# Patient Record
Sex: Female | Born: 1940
Health system: Southern US, Community
[De-identification: ages and names within clinical notes are randomized; demographics above are authoritative.]

## PROBLEM LIST (undated history)

## (undated) DIAGNOSIS — C801 Malignant (primary) neoplasm, unspecified: Secondary | ICD-10-CM

## (undated) DIAGNOSIS — F419 Anxiety disorder, unspecified: Secondary | ICD-10-CM

## (undated) DIAGNOSIS — J189 Pneumonia, unspecified organism: Secondary | ICD-10-CM

## (undated) DIAGNOSIS — J9611 Chronic respiratory failure with hypoxia: Secondary | ICD-10-CM

## (undated) DIAGNOSIS — I10 Essential (primary) hypertension: Secondary | ICD-10-CM

## (undated) DIAGNOSIS — J440 Chronic obstructive pulmonary disease with acute lower respiratory infection: Secondary | ICD-10-CM

## (undated) DIAGNOSIS — R5381 Other malaise: Secondary | ICD-10-CM

## (undated) DIAGNOSIS — H269 Unspecified cataract: Secondary | ICD-10-CM

## (undated) DIAGNOSIS — I059 Rheumatic mitral valve disease, unspecified: Secondary | ICD-10-CM

## (undated) DIAGNOSIS — R5383 Other fatigue: Secondary | ICD-10-CM

## (undated) DIAGNOSIS — A419 Sepsis, unspecified organism: Secondary | ICD-10-CM

## (undated) DIAGNOSIS — J449 Chronic obstructive pulmonary disease, unspecified: Secondary | ICD-10-CM

## (undated) DIAGNOSIS — G459 Transient cerebral ischemic attack, unspecified: Secondary | ICD-10-CM

## (undated) DIAGNOSIS — E785 Hyperlipidemia, unspecified: Secondary | ICD-10-CM

## (undated) DIAGNOSIS — R0602 Shortness of breath: Secondary | ICD-10-CM

## (undated) DIAGNOSIS — R079 Chest pain, unspecified: Secondary | ICD-10-CM

## (undated) DIAGNOSIS — M1990 Unspecified osteoarthritis, unspecified site: Secondary | ICD-10-CM

## (undated) DIAGNOSIS — J441 Chronic obstructive pulmonary disease with (acute) exacerbation: Secondary | ICD-10-CM

## (undated) DIAGNOSIS — E041 Nontoxic single thyroid nodule: Secondary | ICD-10-CM

## (undated) HISTORY — DX: Chronic respiratory failure with hypoxia: J96.11

## (undated) HISTORY — DX: Other malaise: R53.81

## (undated) HISTORY — DX: Transient cerebral ischemic attack, unspecified: G45.9

## (undated) HISTORY — DX: Unspecified osteoarthritis, unspecified site: M19.90

## (undated) HISTORY — DX: Essential (primary) hypertension: I10

## (undated) HISTORY — DX: Chronic obstructive pulmonary disease with (acute) lower respiratory infection: J44.0

## (undated) HISTORY — DX: Shortness of breath: R06.02

## (undated) HISTORY — PX: TONSILLECTOMY: SUR1361

## (undated) HISTORY — DX: Pneumonia, unspecified organism: J18.9

## (undated) HISTORY — DX: Chronic obstructive pulmonary disease, unspecified: J44.9

## (undated) HISTORY — DX: Anxiety disorder, unspecified: F41.9

## (undated) HISTORY — PX: ABDOMINAL HYSTERECTOMY: SHX81

## (undated) HISTORY — DX: Rheumatic mitral valve disease, unspecified: I05.9

## (undated) HISTORY — DX: Hyperlipidemia, unspecified: E78.5

## (undated) HISTORY — DX: Chronic obstructive pulmonary disease with (acute) exacerbation: J44.1

## (undated) HISTORY — DX: Other fatigue: R53.83

## (undated) HISTORY — DX: Sepsis, unspecified organism: A41.9

## (undated) HISTORY — PX: COLECTOMY: SHX59

## (undated) HISTORY — DX: Unspecified cataract: H26.9

## (undated) HISTORY — PX: COLON SURGERY: SHX602

## (undated) HISTORY — DX: Nontoxic single thyroid nodule: E04.1

## (undated) HISTORY — DX: Chest pain, unspecified: R07.9

## (undated) HISTORY — DX: Malignant (primary) neoplasm, unspecified: C80.1

---

## 1999-01-15 ENCOUNTER — Other Ambulatory Visit: Admission: RE | Admit: 1999-01-15 | Discharge: 1999-01-31 | Payer: Self-pay | Admitting: Family Medicine

## 2000-01-28 ENCOUNTER — Other Ambulatory Visit: Admission: RE | Admit: 2000-01-28 | Discharge: 2000-01-28 | Payer: Self-pay | Admitting: Family Medicine

## 2002-03-28 ENCOUNTER — Other Ambulatory Visit: Admission: RE | Admit: 2002-03-28 | Discharge: 2002-03-28 | Payer: Self-pay | Admitting: Family Medicine

## 2004-04-02 ENCOUNTER — Ambulatory Visit: Payer: Self-pay | Admitting: Family Medicine

## 2004-05-01 ENCOUNTER — Ambulatory Visit: Payer: Self-pay | Admitting: Family Medicine

## 2004-08-30 ENCOUNTER — Ambulatory Visit: Payer: Self-pay | Admitting: Family Medicine

## 2004-10-03 ENCOUNTER — Ambulatory Visit: Payer: Self-pay | Admitting: Family Medicine

## 2004-10-15 ENCOUNTER — Ambulatory Visit: Payer: Self-pay | Admitting: Family Medicine

## 2005-03-28 ENCOUNTER — Ambulatory Visit: Payer: Self-pay | Admitting: Family Medicine

## 2005-04-09 ENCOUNTER — Ambulatory Visit: Payer: Self-pay | Admitting: Family Medicine

## 2005-04-09 ENCOUNTER — Other Ambulatory Visit: Admission: RE | Admit: 2005-04-09 | Discharge: 2005-04-09 | Payer: Self-pay | Admitting: Family Medicine

## 2006-05-07 ENCOUNTER — Ambulatory Visit: Payer: Self-pay | Admitting: Oncology

## 2006-09-04 ENCOUNTER — Ambulatory Visit: Payer: Self-pay | Admitting: Oncology

## 2006-11-27 ENCOUNTER — Ambulatory Visit: Payer: Self-pay | Admitting: Oncology

## 2007-12-07 ENCOUNTER — Ambulatory Visit (HOSPITAL_COMMUNITY): Admission: RE | Admit: 2007-12-07 | Discharge: 2007-12-07 | Payer: Self-pay | Admitting: Oncology

## 2011-01-31 LAB — GLUCOSE, CAPILLARY: Glucose-Capillary: 100 — ABNORMAL HIGH

## 2015-11-22 DIAGNOSIS — I10 Essential (primary) hypertension: Secondary | ICD-10-CM

## 2015-11-22 DIAGNOSIS — J440 Chronic obstructive pulmonary disease with acute lower respiratory infection: Secondary | ICD-10-CM

## 2015-11-22 DIAGNOSIS — R5383 Other fatigue: Secondary | ICD-10-CM

## 2015-11-22 DIAGNOSIS — R5381 Other malaise: Secondary | ICD-10-CM

## 2015-11-22 HISTORY — DX: Other malaise: R53.81

## 2015-11-22 HISTORY — DX: Chronic obstructive pulmonary disease with (acute) lower respiratory infection: J44.0

## 2015-11-22 HISTORY — DX: Essential (primary) hypertension: I10

## 2016-05-27 DIAGNOSIS — M8580 Other specified disorders of bone density and structure, unspecified site: Secondary | ICD-10-CM | POA: Insufficient documentation

## 2016-12-03 DIAGNOSIS — E785 Hyperlipidemia, unspecified: Secondary | ICD-10-CM | POA: Insufficient documentation

## 2016-12-03 DIAGNOSIS — R079 Chest pain, unspecified: Secondary | ICD-10-CM | POA: Insufficient documentation

## 2016-12-03 DIAGNOSIS — R0602 Shortness of breath: Secondary | ICD-10-CM

## 2016-12-03 DIAGNOSIS — J449 Chronic obstructive pulmonary disease, unspecified: Secondary | ICD-10-CM | POA: Insufficient documentation

## 2016-12-03 HISTORY — DX: Shortness of breath: R06.02

## 2016-12-03 HISTORY — DX: Hyperlipidemia, unspecified: E78.5

## 2016-12-03 HISTORY — DX: Chest pain, unspecified: R07.9

## 2017-05-07 DIAGNOSIS — L03031 Cellulitis of right toe: Secondary | ICD-10-CM | POA: Diagnosis not present

## 2017-05-08 DIAGNOSIS — H25812 Combined forms of age-related cataract, left eye: Secondary | ICD-10-CM | POA: Diagnosis not present

## 2017-05-08 DIAGNOSIS — H25811 Combined forms of age-related cataract, right eye: Secondary | ICD-10-CM | POA: Diagnosis not present

## 2017-05-08 DIAGNOSIS — H04123 Dry eye syndrome of bilateral lacrimal glands: Secondary | ICD-10-CM | POA: Diagnosis not present

## 2017-05-08 DIAGNOSIS — H2512 Age-related nuclear cataract, left eye: Secondary | ICD-10-CM | POA: Diagnosis not present

## 2017-05-22 DIAGNOSIS — H2511 Age-related nuclear cataract, right eye: Secondary | ICD-10-CM | POA: Diagnosis not present

## 2017-05-22 DIAGNOSIS — H25811 Combined forms of age-related cataract, right eye: Secondary | ICD-10-CM | POA: Diagnosis not present

## 2017-06-23 DIAGNOSIS — I10 Essential (primary) hypertension: Secondary | ICD-10-CM | POA: Diagnosis not present

## 2017-06-23 DIAGNOSIS — E782 Mixed hyperlipidemia: Secondary | ICD-10-CM | POA: Diagnosis not present

## 2017-06-23 DIAGNOSIS — Z79899 Other long term (current) drug therapy: Secondary | ICD-10-CM | POA: Diagnosis not present

## 2017-06-23 DIAGNOSIS — J449 Chronic obstructive pulmonary disease, unspecified: Secondary | ICD-10-CM | POA: Diagnosis not present

## 2017-07-01 DIAGNOSIS — J310 Chronic rhinitis: Secondary | ICD-10-CM | POA: Diagnosis not present

## 2017-07-01 DIAGNOSIS — J449 Chronic obstructive pulmonary disease, unspecified: Secondary | ICD-10-CM | POA: Diagnosis not present

## 2017-07-02 DIAGNOSIS — J449 Chronic obstructive pulmonary disease, unspecified: Secondary | ICD-10-CM | POA: Diagnosis not present

## 2017-07-02 DIAGNOSIS — R0689 Other abnormalities of breathing: Secondary | ICD-10-CM | POA: Diagnosis not present

## 2017-07-07 DIAGNOSIS — J449 Chronic obstructive pulmonary disease, unspecified: Secondary | ICD-10-CM | POA: Diagnosis not present

## 2017-07-16 DIAGNOSIS — J0100 Acute maxillary sinusitis, unspecified: Secondary | ICD-10-CM | POA: Diagnosis not present

## 2017-07-31 DIAGNOSIS — R0689 Other abnormalities of breathing: Secondary | ICD-10-CM | POA: Diagnosis not present

## 2017-07-31 DIAGNOSIS — J449 Chronic obstructive pulmonary disease, unspecified: Secondary | ICD-10-CM | POA: Diagnosis not present

## 2017-08-31 DIAGNOSIS — R0689 Other abnormalities of breathing: Secondary | ICD-10-CM | POA: Diagnosis not present

## 2017-08-31 DIAGNOSIS — J449 Chronic obstructive pulmonary disease, unspecified: Secondary | ICD-10-CM | POA: Diagnosis not present

## 2017-09-30 DIAGNOSIS — J449 Chronic obstructive pulmonary disease, unspecified: Secondary | ICD-10-CM | POA: Diagnosis not present

## 2017-09-30 DIAGNOSIS — R0689 Other abnormalities of breathing: Secondary | ICD-10-CM | POA: Diagnosis not present

## 2017-10-05 DIAGNOSIS — K5730 Diverticulosis of large intestine without perforation or abscess without bleeding: Secondary | ICD-10-CM | POA: Diagnosis not present

## 2017-10-05 DIAGNOSIS — Z85038 Personal history of other malignant neoplasm of large intestine: Secondary | ICD-10-CM | POA: Diagnosis not present

## 2017-10-05 DIAGNOSIS — Z01818 Encounter for other preprocedural examination: Secondary | ICD-10-CM | POA: Diagnosis not present

## 2017-10-13 DIAGNOSIS — J310 Chronic rhinitis: Secondary | ICD-10-CM | POA: Diagnosis not present

## 2017-10-13 DIAGNOSIS — R5383 Other fatigue: Secondary | ICD-10-CM | POA: Diagnosis not present

## 2017-10-13 DIAGNOSIS — J449 Chronic obstructive pulmonary disease, unspecified: Secondary | ICD-10-CM | POA: Diagnosis not present

## 2017-10-20 DIAGNOSIS — I10 Essential (primary) hypertension: Secondary | ICD-10-CM | POA: Diagnosis not present

## 2017-10-20 DIAGNOSIS — K648 Other hemorrhoids: Secondary | ICD-10-CM | POA: Diagnosis not present

## 2017-10-20 DIAGNOSIS — Z980 Intestinal bypass and anastomosis status: Secondary | ICD-10-CM | POA: Diagnosis not present

## 2017-10-20 DIAGNOSIS — Z79899 Other long term (current) drug therapy: Secondary | ICD-10-CM | POA: Diagnosis not present

## 2017-10-20 DIAGNOSIS — Z85038 Personal history of other malignant neoplasm of large intestine: Secondary | ICD-10-CM | POA: Diagnosis not present

## 2017-10-20 DIAGNOSIS — E7800 Pure hypercholesterolemia, unspecified: Secondary | ICD-10-CM | POA: Diagnosis not present

## 2017-10-20 DIAGNOSIS — Z1211 Encounter for screening for malignant neoplasm of colon: Secondary | ICD-10-CM | POA: Diagnosis not present

## 2017-10-20 DIAGNOSIS — K5730 Diverticulosis of large intestine without perforation or abscess without bleeding: Secondary | ICD-10-CM | POA: Diagnosis not present

## 2017-10-20 DIAGNOSIS — K644 Residual hemorrhoidal skin tags: Secondary | ICD-10-CM | POA: Diagnosis not present

## 2017-10-20 DIAGNOSIS — Z7982 Long term (current) use of aspirin: Secondary | ICD-10-CM | POA: Diagnosis not present

## 2017-10-20 DIAGNOSIS — J439 Emphysema, unspecified: Secondary | ICD-10-CM | POA: Diagnosis not present

## 2017-10-20 DIAGNOSIS — D126 Benign neoplasm of colon, unspecified: Secondary | ICD-10-CM | POA: Diagnosis not present

## 2017-10-20 DIAGNOSIS — Z86010 Personal history of colonic polyps: Secondary | ICD-10-CM | POA: Diagnosis not present

## 2017-10-20 DIAGNOSIS — Z87891 Personal history of nicotine dependence: Secondary | ICD-10-CM | POA: Diagnosis not present

## 2017-10-20 DIAGNOSIS — K635 Polyp of colon: Secondary | ICD-10-CM | POA: Diagnosis not present

## 2017-10-31 DIAGNOSIS — J449 Chronic obstructive pulmonary disease, unspecified: Secondary | ICD-10-CM | POA: Diagnosis not present

## 2017-10-31 DIAGNOSIS — R0689 Other abnormalities of breathing: Secondary | ICD-10-CM | POA: Diagnosis not present

## 2017-11-03 DIAGNOSIS — J449 Chronic obstructive pulmonary disease, unspecified: Secondary | ICD-10-CM | POA: Diagnosis not present

## 2017-11-03 DIAGNOSIS — R5383 Other fatigue: Secondary | ICD-10-CM | POA: Diagnosis not present

## 2017-11-03 DIAGNOSIS — J310 Chronic rhinitis: Secondary | ICD-10-CM | POA: Diagnosis not present

## 2017-11-13 DIAGNOSIS — J449 Chronic obstructive pulmonary disease, unspecified: Secondary | ICD-10-CM | POA: Diagnosis not present

## 2017-11-21 DIAGNOSIS — L0390 Cellulitis, unspecified: Secondary | ICD-10-CM | POA: Diagnosis not present

## 2017-11-30 DIAGNOSIS — J449 Chronic obstructive pulmonary disease, unspecified: Secondary | ICD-10-CM | POA: Diagnosis not present

## 2017-11-30 DIAGNOSIS — R0689 Other abnormalities of breathing: Secondary | ICD-10-CM | POA: Diagnosis not present

## 2017-12-14 DIAGNOSIS — J449 Chronic obstructive pulmonary disease, unspecified: Secondary | ICD-10-CM | POA: Diagnosis not present

## 2017-12-28 DIAGNOSIS — I10 Essential (primary) hypertension: Secondary | ICD-10-CM | POA: Diagnosis not present

## 2017-12-28 DIAGNOSIS — F339 Major depressive disorder, recurrent, unspecified: Secondary | ICD-10-CM | POA: Diagnosis not present

## 2017-12-28 DIAGNOSIS — R5381 Other malaise: Secondary | ICD-10-CM | POA: Diagnosis not present

## 2017-12-28 DIAGNOSIS — E782 Mixed hyperlipidemia: Secondary | ICD-10-CM | POA: Diagnosis not present

## 2017-12-28 DIAGNOSIS — E041 Nontoxic single thyroid nodule: Secondary | ICD-10-CM | POA: Diagnosis not present

## 2017-12-28 DIAGNOSIS — R5383 Other fatigue: Secondary | ICD-10-CM | POA: Diagnosis not present

## 2017-12-28 DIAGNOSIS — Z0000 Encounter for general adult medical examination without abnormal findings: Secondary | ICD-10-CM | POA: Diagnosis not present

## 2017-12-28 DIAGNOSIS — D126 Benign neoplasm of colon, unspecified: Secondary | ICD-10-CM | POA: Diagnosis not present

## 2017-12-28 DIAGNOSIS — M8580 Other specified disorders of bone density and structure, unspecified site: Secondary | ICD-10-CM | POA: Diagnosis not present

## 2017-12-28 DIAGNOSIS — J449 Chronic obstructive pulmonary disease, unspecified: Secondary | ICD-10-CM | POA: Diagnosis not present

## 2017-12-28 DIAGNOSIS — M5090 Cervical disc disorder, unspecified, unspecified cervical region: Secondary | ICD-10-CM | POA: Diagnosis not present

## 2017-12-28 DIAGNOSIS — Z79899 Other long term (current) drug therapy: Secondary | ICD-10-CM | POA: Diagnosis not present

## 2017-12-28 DIAGNOSIS — I739 Peripheral vascular disease, unspecified: Secondary | ICD-10-CM | POA: Diagnosis not present

## 2017-12-28 DIAGNOSIS — J310 Chronic rhinitis: Secondary | ICD-10-CM | POA: Diagnosis not present

## 2017-12-28 DIAGNOSIS — I6523 Occlusion and stenosis of bilateral carotid arteries: Secondary | ICD-10-CM | POA: Diagnosis not present

## 2017-12-31 DIAGNOSIS — J449 Chronic obstructive pulmonary disease, unspecified: Secondary | ICD-10-CM | POA: Diagnosis not present

## 2017-12-31 DIAGNOSIS — R0689 Other abnormalities of breathing: Secondary | ICD-10-CM | POA: Diagnosis not present

## 2018-01-14 DIAGNOSIS — J449 Chronic obstructive pulmonary disease, unspecified: Secondary | ICD-10-CM | POA: Diagnosis not present

## 2018-01-29 DIAGNOSIS — H26492 Other secondary cataract, left eye: Secondary | ICD-10-CM | POA: Diagnosis not present

## 2018-01-29 DIAGNOSIS — H26491 Other secondary cataract, right eye: Secondary | ICD-10-CM | POA: Diagnosis not present

## 2018-01-31 DIAGNOSIS — R0689 Other abnormalities of breathing: Secondary | ICD-10-CM | POA: Diagnosis not present

## 2018-01-31 DIAGNOSIS — J449 Chronic obstructive pulmonary disease, unspecified: Secondary | ICD-10-CM | POA: Diagnosis not present

## 2018-02-01 DIAGNOSIS — C44319 Basal cell carcinoma of skin of other parts of face: Secondary | ICD-10-CM | POA: Diagnosis not present

## 2018-02-01 DIAGNOSIS — L570 Actinic keratosis: Secondary | ICD-10-CM | POA: Diagnosis not present

## 2018-02-01 DIAGNOSIS — C44329 Squamous cell carcinoma of skin of other parts of face: Secondary | ICD-10-CM | POA: Diagnosis not present

## 2018-02-04 DIAGNOSIS — Z23 Encounter for immunization: Secondary | ICD-10-CM | POA: Diagnosis not present

## 2018-02-13 DIAGNOSIS — J449 Chronic obstructive pulmonary disease, unspecified: Secondary | ICD-10-CM | POA: Diagnosis not present

## 2018-02-16 DIAGNOSIS — J310 Chronic rhinitis: Secondary | ICD-10-CM | POA: Diagnosis not present

## 2018-02-16 DIAGNOSIS — J449 Chronic obstructive pulmonary disease, unspecified: Secondary | ICD-10-CM | POA: Diagnosis not present

## 2018-02-16 DIAGNOSIS — R5383 Other fatigue: Secondary | ICD-10-CM | POA: Diagnosis not present

## 2018-02-23 DIAGNOSIS — J440 Chronic obstructive pulmonary disease with acute lower respiratory infection: Secondary | ICD-10-CM | POA: Diagnosis not present

## 2018-03-02 DIAGNOSIS — R0689 Other abnormalities of breathing: Secondary | ICD-10-CM | POA: Diagnosis not present

## 2018-03-02 DIAGNOSIS — J449 Chronic obstructive pulmonary disease, unspecified: Secondary | ICD-10-CM | POA: Diagnosis not present

## 2018-03-16 DIAGNOSIS — J449 Chronic obstructive pulmonary disease, unspecified: Secondary | ICD-10-CM | POA: Diagnosis not present

## 2018-03-24 DIAGNOSIS — R5383 Other fatigue: Secondary | ICD-10-CM | POA: Diagnosis not present

## 2018-03-24 DIAGNOSIS — J310 Chronic rhinitis: Secondary | ICD-10-CM | POA: Diagnosis not present

## 2018-03-24 DIAGNOSIS — J449 Chronic obstructive pulmonary disease, unspecified: Secondary | ICD-10-CM | POA: Diagnosis not present

## 2018-03-24 DIAGNOSIS — F411 Generalized anxiety disorder: Secondary | ICD-10-CM | POA: Diagnosis not present

## 2018-04-09 DIAGNOSIS — Z1231 Encounter for screening mammogram for malignant neoplasm of breast: Secondary | ICD-10-CM | POA: Diagnosis not present

## 2018-04-15 DIAGNOSIS — J449 Chronic obstructive pulmonary disease, unspecified: Secondary | ICD-10-CM | POA: Diagnosis not present

## 2018-04-20 DIAGNOSIS — J310 Chronic rhinitis: Secondary | ICD-10-CM | POA: Diagnosis not present

## 2018-04-20 DIAGNOSIS — J449 Chronic obstructive pulmonary disease, unspecified: Secondary | ICD-10-CM | POA: Diagnosis not present

## 2018-04-20 DIAGNOSIS — F411 Generalized anxiety disorder: Secondary | ICD-10-CM | POA: Diagnosis not present

## 2018-04-20 DIAGNOSIS — R5383 Other fatigue: Secondary | ICD-10-CM | POA: Diagnosis not present

## 2018-04-26 DIAGNOSIS — I10 Essential (primary) hypertension: Secondary | ICD-10-CM | POA: Diagnosis not present

## 2018-04-26 DIAGNOSIS — A411 Sepsis due to other specified staphylococcus: Secondary | ICD-10-CM | POA: Diagnosis not present

## 2018-04-26 DIAGNOSIS — E876 Hypokalemia: Secondary | ICD-10-CM | POA: Diagnosis not present

## 2018-04-26 DIAGNOSIS — Z9181 History of falling: Secondary | ICD-10-CM | POA: Diagnosis not present

## 2018-04-26 DIAGNOSIS — Z452 Encounter for adjustment and management of vascular access device: Secondary | ICD-10-CM | POA: Diagnosis not present

## 2018-04-26 DIAGNOSIS — R7989 Other specified abnormal findings of blood chemistry: Secondary | ICD-10-CM | POA: Diagnosis not present

## 2018-04-26 DIAGNOSIS — J439 Emphysema, unspecified: Secondary | ICD-10-CM | POA: Diagnosis not present

## 2018-04-26 DIAGNOSIS — R000 Tachycardia, unspecified: Secondary | ICD-10-CM | POA: Diagnosis not present

## 2018-04-26 DIAGNOSIS — D72829 Elevated white blood cell count, unspecified: Secondary | ICD-10-CM | POA: Diagnosis not present

## 2018-04-26 DIAGNOSIS — R739 Hyperglycemia, unspecified: Secondary | ICD-10-CM | POA: Diagnosis not present

## 2018-04-26 DIAGNOSIS — T380X5A Adverse effect of glucocorticoids and synthetic analogues, initial encounter: Secondary | ICD-10-CM | POA: Diagnosis not present

## 2018-04-26 DIAGNOSIS — J440 Chronic obstructive pulmonary disease with acute lower respiratory infection: Secondary | ICD-10-CM | POA: Diagnosis not present

## 2018-04-26 DIAGNOSIS — J9602 Acute respiratory failure with hypercapnia: Secondary | ICD-10-CM | POA: Diagnosis not present

## 2018-04-26 DIAGNOSIS — R069 Unspecified abnormalities of breathing: Secondary | ICD-10-CM | POA: Diagnosis not present

## 2018-04-26 DIAGNOSIS — J441 Chronic obstructive pulmonary disease with (acute) exacerbation: Secondary | ICD-10-CM | POA: Diagnosis not present

## 2018-04-26 DIAGNOSIS — Z9981 Dependence on supplemental oxygen: Secondary | ICD-10-CM | POA: Diagnosis not present

## 2018-04-26 DIAGNOSIS — E871 Hypo-osmolality and hyponatremia: Secondary | ICD-10-CM | POA: Diagnosis not present

## 2018-04-26 DIAGNOSIS — J189 Pneumonia, unspecified organism: Secondary | ICD-10-CM | POA: Diagnosis not present

## 2018-04-26 DIAGNOSIS — I340 Nonrheumatic mitral (valve) insufficiency: Secondary | ICD-10-CM | POA: Diagnosis not present

## 2018-04-26 DIAGNOSIS — Z79899 Other long term (current) drug therapy: Secondary | ICD-10-CM | POA: Diagnosis not present

## 2018-04-26 DIAGNOSIS — Z7982 Long term (current) use of aspirin: Secondary | ICD-10-CM | POA: Diagnosis not present

## 2018-04-26 DIAGNOSIS — Z87891 Personal history of nicotine dependence: Secondary | ICD-10-CM | POA: Diagnosis not present

## 2018-04-26 DIAGNOSIS — R7881 Bacteremia: Secondary | ICD-10-CM | POA: Diagnosis not present

## 2018-04-26 DIAGNOSIS — A419 Sepsis, unspecified organism: Secondary | ICD-10-CM | POA: Diagnosis not present

## 2018-04-26 DIAGNOSIS — J181 Lobar pneumonia, unspecified organism: Secondary | ICD-10-CM | POA: Diagnosis not present

## 2018-04-26 DIAGNOSIS — R0689 Other abnormalities of breathing: Secondary | ICD-10-CM | POA: Diagnosis not present

## 2018-04-26 DIAGNOSIS — I471 Supraventricular tachycardia: Secondary | ICD-10-CM | POA: Diagnosis not present

## 2018-04-26 DIAGNOSIS — R0602 Shortness of breath: Secondary | ICD-10-CM | POA: Diagnosis not present

## 2018-04-26 DIAGNOSIS — R0902 Hypoxemia: Secondary | ICD-10-CM | POA: Diagnosis not present

## 2018-04-26 DIAGNOSIS — E785 Hyperlipidemia, unspecified: Secondary | ICD-10-CM | POA: Diagnosis not present

## 2018-04-26 DIAGNOSIS — E872 Acidosis: Secondary | ICD-10-CM | POA: Diagnosis not present

## 2018-04-26 DIAGNOSIS — I361 Nonrheumatic tricuspid (valve) insufficiency: Secondary | ICD-10-CM | POA: Diagnosis not present

## 2018-04-26 DIAGNOSIS — J9601 Acute respiratory failure with hypoxia: Secondary | ICD-10-CM | POA: Diagnosis not present

## 2018-04-26 DIAGNOSIS — R5381 Other malaise: Secondary | ICD-10-CM | POA: Diagnosis not present

## 2018-04-27 DIAGNOSIS — I471 Supraventricular tachycardia: Secondary | ICD-10-CM

## 2018-05-03 DIAGNOSIS — Z452 Encounter for adjustment and management of vascular access device: Secondary | ICD-10-CM | POA: Diagnosis not present

## 2018-05-03 DIAGNOSIS — A419 Sepsis, unspecified organism: Secondary | ICD-10-CM | POA: Diagnosis not present

## 2018-05-03 DIAGNOSIS — J181 Lobar pneumonia, unspecified organism: Secondary | ICD-10-CM | POA: Diagnosis not present

## 2018-05-03 DIAGNOSIS — I10 Essential (primary) hypertension: Secondary | ICD-10-CM | POA: Diagnosis not present

## 2018-05-03 DIAGNOSIS — Z9981 Dependence on supplemental oxygen: Secondary | ICD-10-CM | POA: Diagnosis not present

## 2018-05-03 DIAGNOSIS — E871 Hypo-osmolality and hyponatremia: Secondary | ICD-10-CM | POA: Diagnosis not present

## 2018-05-03 DIAGNOSIS — J439 Emphysema, unspecified: Secondary | ICD-10-CM | POA: Diagnosis not present

## 2018-05-03 DIAGNOSIS — R7881 Bacteremia: Secondary | ICD-10-CM | POA: Diagnosis not present

## 2018-05-04 ENCOUNTER — Other Ambulatory Visit: Payer: Self-pay

## 2018-05-04 NOTE — Patient Outreach (Signed)
[facility]) Care Management  05/04/2018  Rachel Evans 1940-05-15 [id number]  Transition of care  Referral date: 05/04/18 Referral source: discharged from an inpatient admission from [facility] on 05/03/18. Insurance: Health team advantage Attempt #1  Telephone call to patient regarding transition of care referral. Unable to reach patient or leave voice message.  Attempted call x 3.  Phone rang busy   PLAN: RNCm will attempt 2nd telephone call to patient within 4 business days.  RNCm will send patient outreach letter to attempt contact.   Quinn Plowman RN,BSN,CCM [facility] Telephonic  [phone]

## 2018-05-05 DIAGNOSIS — R7881 Bacteremia: Secondary | ICD-10-CM | POA: Diagnosis not present

## 2018-05-06 ENCOUNTER — Other Ambulatory Visit: Payer: Self-pay

## 2018-05-06 NOTE — Patient Outreach (Signed)
[facility]) Care Management  05/06/2018  Rachel Evans Jun 04, 1940 [id number]   Transition of care  Referral date: 05/04/18 Referral source: discharged from an inpatient admission from [facility] on 05/03/18. Insurance: Health team advantage  Telephone call to patient regarding transition of care referral. HIPAA verified with patient. Explained reason for call. Patient states she is doing much better.  Patient states she was in the hospital for pneumonia. She states she feels much better. She states she was on oxygen at 5 L when she was discharged from the hospital. She states she is now back on her normal oxygen level of 3 L for her COPD.  Patient states she has a pic line and is administering her IV antibiotic. Patient reports [facility] health nurse has shown her how to administer.  Patient states she has a follow up appointment with her primary MD on 05/10/18.  Patient states she is able drive herself to her appointments.  Patient states she has been losing weight. She states her current weight is 95 lbs and her height is 5 ft 1.  Patient states she loses weight when she is stressed.  Patient states she lost a family member within the past 3 years and is helping to take care of her family  Member that has dementia.  Patient states she is drinking carnation instant breakfast drink to help her with the weight loss.  RNCM advised patient to discuss her weight loss with her primary care provider at her next visit. Patient verbalized understanding. RNCM discussed signs /symptoms of infection with patient Advised patient to notify the home care nurse and/ or her doctor for any infection symptoms near her pic LINE.   Patient verbalized understanding.  RNCM discussed and offered ongoing transition of care follow up. Patient verbally agreed.  RNCM advised patient to notify MD of any changes in condition prior to scheduled appointment. RNCM provided contact name and number:  [phone] or main office number [phone] and 24 hour nurse advise line [phone].  RNCM verified patient aware of 911 services for urgent/ emergent needs.  PLAN;  RNCM will follow up with patient within 1 week  RNCM will send involvement letter to patients primary MD  RNCM will send welcome packet/ consent/ EMMI education material to patient.   Quinn Plowman RN,BSN,CCM [facility] Telephonic  [phone]

## 2018-05-10 DIAGNOSIS — J9601 Acute respiratory failure with hypoxia: Secondary | ICD-10-CM | POA: Diagnosis not present

## 2018-05-10 DIAGNOSIS — R5381 Other malaise: Secondary | ICD-10-CM | POA: Diagnosis not present

## 2018-05-10 DIAGNOSIS — J441 Chronic obstructive pulmonary disease with (acute) exacerbation: Secondary | ICD-10-CM | POA: Insufficient documentation

## 2018-05-10 DIAGNOSIS — A419 Sepsis, unspecified organism: Secondary | ICD-10-CM | POA: Diagnosis not present

## 2018-05-10 DIAGNOSIS — J440 Chronic obstructive pulmonary disease with acute lower respiratory infection: Secondary | ICD-10-CM | POA: Diagnosis not present

## 2018-05-10 DIAGNOSIS — R5383 Other fatigue: Secondary | ICD-10-CM | POA: Diagnosis not present

## 2018-05-10 DIAGNOSIS — Z87891 Personal history of nicotine dependence: Secondary | ICD-10-CM | POA: Diagnosis not present

## 2018-05-10 DIAGNOSIS — R7881 Bacteremia: Secondary | ICD-10-CM | POA: Diagnosis not present

## 2018-05-10 DIAGNOSIS — J181 Lobar pneumonia, unspecified organism: Secondary | ICD-10-CM | POA: Diagnosis not present

## 2018-05-10 HISTORY — DX: Chronic obstructive pulmonary disease with (acute) exacerbation: J44.1

## 2018-05-12 ENCOUNTER — Ambulatory Visit: Payer: Self-pay

## 2018-05-13 ENCOUNTER — Ambulatory Visit: Payer: Self-pay

## 2018-05-13 DIAGNOSIS — A419 Sepsis, unspecified organism: Secondary | ICD-10-CM | POA: Diagnosis not present

## 2018-05-13 DIAGNOSIS — E871 Hypo-osmolality and hyponatremia: Secondary | ICD-10-CM | POA: Diagnosis not present

## 2018-05-13 DIAGNOSIS — J439 Emphysema, unspecified: Secondary | ICD-10-CM | POA: Diagnosis not present

## 2018-05-13 DIAGNOSIS — I10 Essential (primary) hypertension: Secondary | ICD-10-CM | POA: Diagnosis not present

## 2018-05-13 DIAGNOSIS — J181 Lobar pneumonia, unspecified organism: Secondary | ICD-10-CM | POA: Diagnosis not present

## 2018-05-14 ENCOUNTER — Other Ambulatory Visit: Payer: Self-pay

## 2018-05-14 DIAGNOSIS — R7881 Bacteremia: Secondary | ICD-10-CM | POA: Diagnosis not present

## 2018-05-14 NOTE — Patient Outreach (Addendum)
[facility]) Care Management  05/14/2018  Rachel Evans Sep 18, 1940 [id number]   Transition of care  Referral date:05/04/18 Referral source:discharged from an inpatient admission from [facility] on 05/03/18. Insurance:Health team advantage Attempt #1  Telephone call to patient regarding referral. Unable to reach patient. HIPAA compliant voice message left with call back phone number.   PLAN: RNCM will attempt 2nd telephone call to patient within 4 business days.   Quinn Plowman RN,BSN, [place] Telephonic  [phone]

## 2018-05-16 DIAGNOSIS — J449 Chronic obstructive pulmonary disease, unspecified: Secondary | ICD-10-CM | POA: Diagnosis not present

## 2018-05-17 DIAGNOSIS — J181 Lobar pneumonia, unspecified organism: Secondary | ICD-10-CM | POA: Diagnosis not present

## 2018-05-17 DIAGNOSIS — A419 Sepsis, unspecified organism: Secondary | ICD-10-CM | POA: Diagnosis not present

## 2018-05-17 DIAGNOSIS — Z452 Encounter for adjustment and management of vascular access device: Secondary | ICD-10-CM | POA: Diagnosis not present

## 2018-05-18 ENCOUNTER — Other Ambulatory Visit: Payer: Self-pay

## 2018-05-18 DIAGNOSIS — A419 Sepsis, unspecified organism: Secondary | ICD-10-CM | POA: Insufficient documentation

## 2018-05-18 DIAGNOSIS — J189 Pneumonia, unspecified organism: Secondary | ICD-10-CM | POA: Insufficient documentation

## 2018-05-18 DIAGNOSIS — J441 Chronic obstructive pulmonary disease with (acute) exacerbation: Secondary | ICD-10-CM

## 2018-05-18 NOTE — Patient Outreach (Addendum)
[facility]) Care Management  05/18/2018  Rachel Evans 03/25/1941 [id number]  Initial assessment / Transition of care  Referral date:05/04/18 Referral source:discharged from an inpatient admission from [facility] on 05/03/18. Insurance:Health team advantage  SUBJECTIVE; Telephone call to patient regarding initial assessment. HIPAA verified with patient. Patient states she has been doing well.  She states she has gained 5 lbs since she has been home from the hospital.  Patient states her goal weight is 115 to 120 lbs.  Patient states she has been eating more fatting foods / sweets.  She states she does not eat much at one sitting.  She eats more small frequent meals. Patient states she loses weight when she is under stress. She reports having weight loss within the past year.  RNCM discussed and reviewed EMMI education article on weight loss. Discussed ways to increase weight.  Patient verbalized understanding. RNCM discussed and offered [facility] care management follow up for stress management. Patient declined at this time.  Patient states she will complete her IV antibiotic treatment on tomorrow. She states she is scheduled to have a chest xray, Blood cultures, and follow up with her primary MD on 05/20/18.   Patient states she his being management by a pulmonologist. She states she is in the process to changing to a new pulmonary doctor,. She reports her doctor referred her to a new provider a few days ago.   Patient states she is on oxygen 24 / 7 days at 3 L.  She states she becomes short of breath with any exertion. Patient states this is normal for her.  Patient denies any new symptoms/ concerns at this time.  Patient verbally agreed to next telephone outreach with [facility] .  RNCM advised patient to notify MD of any changes in condition prior to scheduled appointment. RNCM verified patient aware of 911 services for urgent/ emergent needs.   Medications Reviewed Today    Reviewed by Dannielle Karvonen, RN (Registered Nurse) on 05/18/18 at 46  Med List Status: <None>  Medication Order Taking? Sig Documenting Provider Last Dose Status Informant  ALPRAZolam (XANAX) 0.25 MG tablet [id number] No Take 0.25 mg by mouth 3 (three) times daily. [provider] Taking Active Self  aspirin EC 81 MG tablet [id number] No Take by mouth. [provider] Taking Active   b complex vitamins capsule [id number] No Take by mouth. [provider] Taking Active   ceFAZolin (ANCEF) 2-3 GM-%(50ML) SOLR [id number] No Inject into the vein. [provider] Taking Active   Cholecalciferol (VITAMIN D-1000 MAX ST) 25 MCG (1000 UT) tablet [id number] No Take by mouth. [provider] Taking Active   fluticasone (FLONASE) 50 MCG/ACT nasal spray [id number] No 1 spray by Each Nare route daily. [provider] Taking Active   lisinopril-hydrochlorothiazide (PRINZIDE,ZESTORETIC) 10-12.5 MG tablet [id number] No TAKE 1 TABLET BY MOUTH ONCE (1) DAILY [provider] Taking Active   Omega-3 1000 MG CAPS [id number] No Take by mouth. [provider] Taking Active            Med Note Nyoka Cowden, Jaquetta Currier E   Thu May 06, 2018 11:55 AM) Patient states she takes 1 caps per day  pravastatin (PRAVACHOL) 40 MG tablet [id number] No TAKE 1/2 TABLET BY MOUTH AT bedtime. [provider] Taking Active   sertraline (ZOLOFT) 50 MG tablet [id number] No Take by mouth. [provider] Taking Active           Functional Status Survey: Is the patient  deaf or have difficulty hearing?: Yes(left ear) Does the patient have difficulty seeing, even when wearing glasses/contacts?: No Does the patient have difficulty concentrating, remembering, or making decisions?: No Does the patient have difficulty walking or climbing stairs?: Yes(exertion due to COPD ) Does the patient have difficulty dressing or bathing?: No Does the patient have difficulty doing errands alone  such as visiting a doctor's office or shopping?: No   Fall Risk  05/18/2018  Falls in the past year? 0   Depression screen PHQ 2/9 05/18/2018  Decreased Interest 1  Down, Depressed, Hopeless 0  PHQ - 2 Score 1    THN CM Care Plan Problem One     Most Recent Value  Care Plan Problem One  Weight loss  Role Documenting the Problem One  Care Management Telephonic Coordinator  Care Plan for Problem One  Active  THN Long Term Goal   Patient will report increase weight gain of 5 lbs within 30 days  THN Long Term Goal Start Date  05/06/18  [facility] Long Term Goal Met Date  05/18/18  THN CM Short Term Goal #1   patient will verbalize 2 strategies for weight gain  THN CM Short Term Goal #1 Start Date  05/06/18  [facility] CM Short Term Goal #1 Met Date  05/18/18  THN CM Short Term Goal #2   patient will verbalize 3 strategies for stress management  THN CM Short Term Goal #2 Start Date  05/06/18  [facility] CM Short Term Goal #2 Met Date  -- [ongoing goal ]  Interventions for Short Term Goal #2  RNCM will mail patient EMMI education material on stress management strategies      ASSESSMENT: ongoing transition of care follow up  PLAN; RNCM will follow up with patient within 1 week.  RNCM will send patient EMMI education on stress management  Quinn Plowman RN,BSN,CCM [facility] Telephonic  [phone]

## 2018-05-20 DIAGNOSIS — I700 Atherosclerosis of aorta: Secondary | ICD-10-CM | POA: Diagnosis not present

## 2018-05-20 DIAGNOSIS — R0602 Shortness of breath: Secondary | ICD-10-CM | POA: Diagnosis not present

## 2018-05-20 DIAGNOSIS — I059 Rheumatic mitral valve disease, unspecified: Secondary | ICD-10-CM | POA: Diagnosis not present

## 2018-05-20 DIAGNOSIS — J440 Chronic obstructive pulmonary disease with acute lower respiratory infection: Secondary | ICD-10-CM | POA: Diagnosis not present

## 2018-05-20 DIAGNOSIS — J189 Pneumonia, unspecified organism: Secondary | ICD-10-CM | POA: Diagnosis not present

## 2018-05-20 DIAGNOSIS — I6523 Occlusion and stenosis of bilateral carotid arteries: Secondary | ICD-10-CM | POA: Diagnosis not present

## 2018-05-20 DIAGNOSIS — I517 Cardiomegaly: Secondary | ICD-10-CM | POA: Diagnosis not present

## 2018-05-25 ENCOUNTER — Ambulatory Visit: Payer: Self-pay

## 2018-05-27 ENCOUNTER — Other Ambulatory Visit: Payer: Self-pay

## 2018-05-27 NOTE — Patient Outreach (Signed)
[facility]) Care Management  05/27/2018  Rachel Evans 10/09/1940 [id number]  Telephone assessment: Referral date:05/04/18 Referral source:discharged from an inpatient admission from [facility] on 05/03/18. Insurance:Health team advantage  Telephone call to patient for telephone assessment follow up. HIPAA verified with patient. Explained reason for call. Patient state she is doing pretty well.  She states her weight still goes up and down some based on how much she eats. Patient states she doesn't eat much on a normal basis. She reports yesterday's weight was 98.6 lbs. Patient states she usually reaches 100 lbs by the end of the day.    Patient states she received the EMMI education material in the mail.  She states she has reviewed over the information provided and does not have any questions.  Patient states the lab work for her blood cultures came back normal.  She states her PICC line was discontinued on yesterday.   Patient states her most recent chest xray still shows a small amount of pneumonia in the left lower lung lobe. Patient denies having any visible symptoms.   Patient states she is going to call her doctors office today to see if they feel she needs an additional antibiotic.   Patient states her COPD is doing ok. She states when she is on oxygen she is fine. She states if she is sitting with no exertion she is able to go without her oxygen. Patient states if she has any exertion she has to wear it.  Patient states she is feeling better related to her stress. 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 She states this will be complete by the end of the month.   Patient states she is feeling better about managing the work load  related to the taxes now.   Patient denies any further concerns at this time.  RNCM contacted patients primary MD office and spoke with Lelan Pons regarding patients follow up appointment. Lelan Pons states patient was  recently seen by her primary MD on 05/20/18 and has a follow up on 06/30/18.  PLAN: RNCm will follow up with patient within 2 weeks   Quinn Plowman RN,BSN,CCM [facility] Telephonic  [phone]

## 2018-06-03 ENCOUNTER — Ambulatory Visit: Payer: PPO | Admitting: Internal Medicine

## 2018-06-03 ENCOUNTER — Encounter: Payer: Self-pay | Admitting: Internal Medicine

## 2018-06-03 VITALS — BP 120/70 | HR 83 | Ht 61.0 in | Wt 96.6 lb

## 2018-06-03 DIAGNOSIS — J9611 Chronic respiratory failure with hypoxia: Secondary | ICD-10-CM

## 2018-06-03 DIAGNOSIS — J449 Chronic obstructive pulmonary disease, unspecified: Secondary | ICD-10-CM

## 2018-06-03 DIAGNOSIS — J189 Pneumonia, unspecified organism: Secondary | ICD-10-CM

## 2018-06-03 DIAGNOSIS — J181 Lobar pneumonia, unspecified organism: Secondary | ICD-10-CM | POA: Diagnosis not present

## 2018-06-03 HISTORY — DX: Chronic obstructive pulmonary disease, unspecified: J44.9

## 2018-06-03 LAB — CBC WITH DIFFERENTIAL/PLATELET
Basophils Absolute: 0 10*3/uL (ref 0.0–0.1)
Basophils Relative: 0.8 % (ref 0.0–3.0)
EOS ABS: 0.1 10*3/uL (ref 0.0–0.7)
EOS PCT: 1.5 % (ref 0.0–5.0)
HCT: 38.2 % (ref 36.0–46.0)
HEMOGLOBIN: 12.8 g/dL (ref 12.0–15.0)
Lymphocytes Relative: 16.2 % (ref 12.0–46.0)
Lymphs Abs: 0.9 10*3/uL (ref 0.7–4.0)
MCHC: 33.5 g/dL (ref 30.0–36.0)
MCV: 94.4 fl (ref 78.0–100.0)
Monocytes Absolute: 0.5 10*3/uL (ref 0.1–1.0)
Monocytes Relative: 9.7 % (ref 3.0–12.0)
Neutro Abs: 4 10*3/uL (ref 1.4–7.7)
Neutrophils Relative %: 71.8 % (ref 43.0–77.0)
Platelets: 284 10*3/uL (ref 150.0–400.0)
RBC: 4.05 Mil/uL (ref 3.87–5.11)
RDW: 14.3 % (ref 11.5–15.5)
WBC: 5.6 10*3/uL (ref 4.0–10.5)

## 2018-06-03 LAB — SEDIMENTATION RATE: SED RATE: 9 mm/h (ref 0–30)

## 2018-06-03 NOTE — Patient Instructions (Addendum)
Plan A = Automatic = Trelegy one click daily    Plan B = Backup Only use your albuterol(proair) inhaler as a rescue medication to be used if you can't catch your breath by resting or doing a relaxed purse lip breathing pattern.  - The less you use it, the better it will work when you need it. - Ok to use the inhaler up to 2 puffs  every 4 hours if you must but call for appointment if use goes up over your usual need - Don't leave home without it !!  (think of it like the spare tire for your car)   Work on inhaler technique:  relax and gently blow all the way out then take a nice smooth deep breath back in, triggering the inhaler at same time you start breathing in.  Hold for up to 5 seconds if you can. Blow out thru nose. Rinse and gargle with water when done      Plan C = Crisis - only use your levoalbuterol nebulizer if you first try Plan B and it fails to help > ok to use the nebulizer up to every 4 hours but if start needing it regularly call for immediate appointment   Plan D = Doctor - call me if B and C not adequate  Plan E = ER - go to ER or call 911 if all else fails     Please remember to go to the lab department   for your tests - we will call you with the results when they are available.      Please schedule a follow up office visit in 2 weeks, sooner if needed  with all medications /inhalers/ solutions in hand so we can verify exactly what you are taking. This includes all medications from all doctors and over the counters

## 2018-06-03 NOTE — Progress Notes (Signed)
Neysa Hotter, female    DOB: June 13, 1940,    MRN: [id number]   Brief patient profile:  77 yowf   quit smoking 2007 with doe to point where trouble steps but did not req 02 or meds but gradually worse and by  2015 rx= various inhalers and even 02 but didn't use consistently with doe = MMRC2   then acutely worse late Oct 2019 green sputum on trelegy rx levaquin > sore leg tendons so rx augmentin which helped mucus but breathing worse rx nebulizer xopenex then admitted 04/26/18 with "pna/sepsis"@ [facility]  Floor bed > d/c May 03 2018 on IV ancef which was complete Jan 15th 2020 and referred to pulmonary clinic 06/03/2018 by Dr   Gilford Rile.    History of Present Illness  06/03/2018  Pulmonary/ 1st office eval/Tamisha Nordstrom  Chief Complaint  Patient presents with  . Pulmonary Consult    Referred by Dr. Gilford Rile. Pt states had PNA 04/26/18. She c/o SOB "sometimes"- occ gets winded walking room to room. She has occ cough with clear sputum.   Dyspnea:  MMRC3 = can't walk 100 yards even at a slow pace at a flat grade s stopping due to sob   Cough: clear /no am flares Sleep: on side hob on 1 pillow flat bed x eet  SABA use: not using proair/ has neb with levoalbuterol / back on trelgy now / a bit confused when when / how to use saba  02  None at rest/ 3lpm hs and 3 pulsed  out Typically sats are maint  low 90s at rest RA and upper 80s on POC   No obvious day to day or daytime variability or assoc purulent sputum or mucus plugs or hemoptysis or cp or chest tightness, subjective wheeze or overt sinus or hb symptoms.   Sleeping  without nocturnal  or early am exacerbation  of respiratory  c/o's or need for noct saba. Also denies any obvious fluctuation of symptoms with weather or environmental changes or other aggravating or alleviating factors except as outlined above   No unusual exposure hx or h/o childhood pna/ asthma or knowledge of premature birth.  Current Allergies, Complete Past Medical History,  Past Surgical History, Family History, and Social History were reviewed in [employer] record.  ROS  The following are not active complaints unless bolded Hoarseness, sore throat, dysphagia, dental problems, itching, sneezing,  nasal congestion or discharge of excess mucus or purulent secretions, ear ache,   fever, chills, sweats, unintended wt loss or wt gain, classically pleuritic or exertional cp,  orthopnea pnd or arm/hand swelling  or leg swelling, presyncope, palpitations, abdominal pain, anorexia, nausea, vomiting, diarrhea  or change in bowel habits or change in bladder habits, change in stools or change in urine, dysuria, hematuria,  rash, arthralgias, visual complaints, headache, numbness, weakness or ataxia or problems with walking or coordination,  change in mood or  memory.          Past Medical History:  Diagnosis Date  . Anxiety   . Arthritis   . Cancer ([place])    colon cancer 2007  . Cataract   . COPD (chronic obstructive pulmonary disease) ([place])    patient diagnosed in 2013  . Hypertension   . Pneumonia   . Sepsis ([place])   . TIA (transient ischemic attack)       Outpatient Medications Prior to Visit  Medication Sig Dispense Refill  . albuterol (PROVENTIL HFA;VENTOLIN HFA) 108 (90 Base)  MCG/ACT inhaler Inhale 2 puffs into the lungs every 4 (four) hours as needed.    . ALPRAZolam (XANAX) 0.25 MG tablet Take 0.25 mg by mouth 3 (three) times daily.    Marland Kitchen aspirin EC 81 MG tablet Take by mouth.    Marland Kitchen b complex vitamins capsule Take by mouth.    . Cholecalciferol (VITAMIN D-1000 MAX ST) 25 MCG (1000 UT) tablet Take by mouth.    . doxycycline (VIBRAMYCIN) 100 MG capsule Take 1 capsule by mouth 2 (two) times daily.    . fluticasone (FLONASE) 50 MCG/ACT nasal spray 1 spray by Each Nare route daily.    . Fluticasone-Umeclidin-Vilant 100-62.5-25 MCG/INH AEPB Inhale 1 puff into the lungs daily.    Marland Kitchen levalbuterol (XOPENEX) 0.63 MG/3ML nebulizer solution 1 vial 4  x daily as needed    . lisinopril-hydrochlorothiazide (PRINZIDE,ZESTORETIC) 10-12.5 MG tablet TAKE 1 TABLET BY MOUTH ONCE (1) DAILY    . Omega-3 1000 MG CAPS Take by mouth.    . OXYGEN 3lpm with sleep and then as needed during the day  [employer]    . pravastatin (PRAVACHOL) 40 MG tablet TAKE 1/2 TABLET BY MOUTH AT bedtime.    . Probiotic Product (PROBIOTIC ADVANCED) CAPS Take 1 capsule by mouth daily.    . sertraline (ZOLOFT) 50 MG tablet Take by mouth.    Marland Kitchen ceFAZolin (ANCEF) 2-3 GM-%(50ML) SOLR Inject into the vein.           Objective:     BP 120/70 (BP Location: Left Arm, Cuff Size: Normal)   Pulse 83   Ht 5\' 1"  (1.549 m)   Wt 96 lb 9.6 oz (43.8 kg)   SpO2 98%   BMI 18.25 kg/m   SpO2: 98 % O2 Type: Pulse O2 O2 Flow Rate (L/min): 3 L/min   amb wf nad at rest   HEENT: nl dentition / oropharynx. Nl external ear canals without cough reflex -  Mild bilateral non-specific turbinate edema     NECK :  without JVD/Nodes/TM/ nl carotid upstrokes bilaterally   LUNGS: no acc muscle use,  Mod barrel  contour chest wall with bilateral  Distant bs s audible wheeze and  without cough on insp or exp maneuver and mod  Hyperresonant  to  percussion bilaterally     CV:  RRR  no s3 or murmur or increase in P2, and  Trace bilaterally sym ankle edema ABD:  soft and nontender with pos mid insp Hoover's  in the supine position. No bruits or organomegaly appreciated, bowel sounds nl  MS:   Nl gait/  ext warm without deformities, calf tenderness, cyanosis or clubbing No obvious joint restrictions   SKIN: warm and dry without lesions    NEURO:  alert, approp, nl sensorium with  no motor or cerebellar deficits apparent.       Labs ordered 06/03/2018 alpha one screen   Lab Results  Component Value Date   WBC 5.6 06/03/2018   HGB 12.8 06/03/2018   HCT 38.2 06/03/2018   MCV 94.4 06/03/2018   PLT 284.0 06/03/2018       EOS                                                               0.1  06/03/2018      Lab Results  Component Value Date   ESRSEDRATE 9 06/03/2018      I personally reviewed images and agree with radiology impression as follows:  CXR:   05/20/18 vs 04/26/18  Copd with residual infiltrate LLL  post basal segment     Assessment   No problem-specific Assessment & Plan notes found for this encounter.     Christinia Gully, MD 06/03/2018

## 2018-06-04 ENCOUNTER — Encounter: Payer: Self-pay | Admitting: Internal Medicine

## 2018-06-04 DIAGNOSIS — J9611 Chronic respiratory failure with hypoxia: Secondary | ICD-10-CM

## 2018-06-04 HISTORY — DX: Chronic respiratory failure with hypoxia: J96.11

## 2018-06-04 NOTE — Assessment & Plan Note (Signed)
Quit smoking 2007 - 02 dep 2015  - 06/03/2018  After extensive coaching inhaler device,  effectiveness =   75% - alpha one AT screen 06/03/2018    When respiratory symptoms begin or become refractory well after a patient reports complete smoking cessation,  Especially when this wasn't the case while they were smoking, a red flag is raised based on the work of Dr Kris Mouton which states:  if you quit smoking when your best day FEV1 is still well preserved it is highly unlikely you will progress to severe disease.  That is to say, once the smoking stops,  the symptoms should not suddenly erupt or markedly worsen.  If so, the differential diagnosis should include  obesity/deconditioning,  LPR/Reflux/Aspiration syndromes,  occult CHF, or  especially side effect of medications commonly used in this population.     No obvious explanation for why she's been gradually worse over time so needs alpha one screening and return here with all meds in hand using a trust but verify approach to confirm accurate Medication  Reconciliation The principal here is that until we are certain that the  patients are doing what we've asked, it makes no sense to ask them to do more.

## 2018-06-04 NOTE — Assessment & Plan Note (Signed)
02 dep since 2015 though still not needing at rest as recently as 04/2018   Reviewed goals of keeping sats > 90% for now with adjustments at rest and with activity but continue 02 3lpm hs for now

## 2018-06-04 NOTE — Assessment & Plan Note (Signed)
Onset 04/16/18 > see Oval Linsey admit - She has signicant residual changes in LLL but clinically doing much better so will hold off for another 2 weeks and repeat cxr then to account for lags that typically occur p cap esp in copd population  Discussed in detail all the  indications, usual  risks and alternatives  relative to the benefits with patient who agrees to proceed with conservative f/u as outlined     Total time devoted to counseling  > 50 % of initial 60 min office visit:  review case with pt/  device teaching which extended face to face time for this visit discussion of options/alternatives/ personally creating written customized instructions  in presence of pt  then going over those specific  Instructions directly with the pt including how to use all of the meds but in particular covering each new medication in detail and the difference between the maintenance= "automatic" meds and the prns using an action plan format for the latter (If this problem/symptom => do that organization reading Left to right).  Please see AVS from this visit for a full list of these instructions which I personally wrote for this pt and  are unique to this visit.

## 2018-06-11 ENCOUNTER — Other Ambulatory Visit: Payer: Self-pay

## 2018-06-11 DIAGNOSIS — I059 Rheumatic mitral valve disease, unspecified: Secondary | ICD-10-CM

## 2018-06-11 DIAGNOSIS — E041 Nontoxic single thyroid nodule: Secondary | ICD-10-CM | POA: Insufficient documentation

## 2018-06-11 HISTORY — DX: Rheumatic mitral valve disease, unspecified: I05.9

## 2018-06-11 HISTORY — DX: Nontoxic single thyroid nodule: E04.1

## 2018-06-11 LAB — ALPHA-1 ANTITRYPSIN PHENOTYPE: A-1 Antitrypsin, Ser: 160 mg/dL (ref 83–199)

## 2018-06-11 NOTE — Patient Outreach (Addendum)
[facility]) Care Management  06/11/2018  JAMARIE JOPLIN November 01, 1940 [id number]  Telephone assessment: Referral date:05/04/18 Insurance:Health team advantage Attempt #1  Telephone call to patient regarding telephone assessment follow up. .  Unable to reach patient. HIPAA compliant voice message left with call back phone number.   PLAN: RNCM will attempt # telephone call to patient within 4 business days.   Quinn Plowman RN,BSN, [place] Telephonic  [phone]

## 2018-06-14 ENCOUNTER — Ambulatory Visit: Payer: Self-pay

## 2018-06-16 ENCOUNTER — Ambulatory Visit: Payer: PPO | Admitting: Cardiology

## 2018-06-16 DIAGNOSIS — J449 Chronic obstructive pulmonary disease, unspecified: Secondary | ICD-10-CM | POA: Diagnosis not present

## 2018-06-17 ENCOUNTER — Encounter: Payer: Self-pay | Admitting: Internal Medicine

## 2018-06-17 ENCOUNTER — Ambulatory Visit (INDEPENDENT_AMBULATORY_CARE_PROVIDER_SITE_OTHER): Payer: PPO | Admitting: Internal Medicine

## 2018-06-17 ENCOUNTER — Ambulatory Visit (INDEPENDENT_AMBULATORY_CARE_PROVIDER_SITE_OTHER)
Admission: RE | Admit: 2018-06-17 | Discharge: 2018-06-17 | Disposition: A | Discharge status: Home / Self Care | Payer: PPO | Source: Ambulatory Visit | Attending: Internal Medicine | Admitting: Internal Medicine

## 2018-06-17 VITALS — BP 110/78 | HR 94 | Ht 61.0 in | Wt 95.8 lb

## 2018-06-17 DIAGNOSIS — J449 Chronic obstructive pulmonary disease, unspecified: Secondary | ICD-10-CM

## 2018-06-17 DIAGNOSIS — J189 Pneumonia, unspecified organism: Secondary | ICD-10-CM | POA: Diagnosis not present

## 2018-06-17 DIAGNOSIS — I10 Essential (primary) hypertension: Secondary | ICD-10-CM

## 2018-06-17 DIAGNOSIS — J9611 Chronic respiratory failure with hypoxia: Secondary | ICD-10-CM

## 2018-06-17 NOTE — Assessment & Plan Note (Addendum)
02 dep since 2015 though still not needing at rest as recently as 04/2018  - no longer needing at rest as of 06/17/2018   rec 2.5 lpm at hs, adjust wit activity to keep sats > 90% or slow down./ pace better if turn POC all the way and still not achieving adequate sats

## 2018-06-17 NOTE — Progress Notes (Signed)
Rachel Evans, female    DOB: Dec 24, 1940,    MRN: [id number]   Brief patient profile:  6 yowf  MM quit smoking 2007 with doe to point where trouble steps but did not req 02 or meds but gradually worse and by  2015 rx= various inhalers and even 02 but didn't use consistently with doe = MMRC2   then acutely worse late Oct 2019 green sputum on trelegy rx levaquin > sore leg tendons so rx augmentin which helped mucus but breathing worse rx nebulizer xopenex then admitted 04/26/18 with "pna/sepsis"@ [facility]  Floor bed > d/c May 03 2018 on IV ancef which was complete Jan 15th 2020 and referred to pulmonary clinic 06/03/2018 by Dr   Gilford Rile.with GOLD III criteria by spirometry 06/17/2018     History of Present Illness  06/03/2018  Pulmonary/ 1st office eval/Rachel Evans  Chief Complaint  Patient presents with  . Pulmonary Consult    Referred by Dr. Gilford Rile. Pt states had PNA 04/26/18. She c/o SOB "sometimes"- occ gets winded walking room to room. She has occ cough with clear sputum.   Dyspnea:  MMRC3 = can't walk 100 yards even at a slow pace at a flat grade s stopping due to sob   Cough: clear /no am flares Sleep: on side hob on 1 pillow flat bed  SABA use: not using proair/ has neb with levoalbuterol / back on trelgy now / a bit confused when when / how to use saba  02  None at rest/ 3lpm hs and 3 pulsed  out Typically sats are maint  low 90s at rest RA and upper 80s on POC rec Plan A = Automatic = Trelegy one click daily  Plan B = Backup Only use your albuterol(proair) inhaler as a rescue medication  Work on inhaler technique:  Plan C = Crisis - only use your levoalbuterol nebulizer if you first try Plan B    06/17/2018  f/u ov/Rachel Evans re: GOLD 3 / 02 dep hs and ex on trelegy maint  Chief Complaint  Patient presents with  . Follow-up    breathing has been worse over the past 2 days. Her voice has been hoarse but she is not coughing much. She rarely uses her albuterol inhaler or xopenex  neb.   Dyspnea:  Was able to cross parking lot into [employer] s 02 and shopped  Cough: dry raspy daytime aggravated by voice use (on ACEi)  Sleeping: on side flat bed/ one pillow  SABA use: none  02:  Sleeping on 2.5,  At rest 92%  RA and most she does is usually 4lpm   No obvious day to day or daytime variability or assoc excess/ purulent sputum or mucus plugs or hemoptysis or cp or chest tightness, subjective wheeze or overt sinus or hb symptoms.   Sleeping  without nocturnal  or early am exacerbation  of respiratory  c/o's or need for noct saba. Also denies any obvious fluctuation of symptoms with weather or environmental changes or other aggravating or alleviating factors except as outlined above   No unusual exposure hx or h/o childhood pna/ asthma or knowledge of premature birth.  Current Allergies, Complete Past Medical History, Past Surgical History, Family History, and Social History were reviewed in [employer] record.  ROS  The following are not active complaints unless bolded Hoarseness, sore throat, dysphagia, dental problems, itching, sneezing,  nasal congestion or discharge of excess mucus or purulent secretions, ear ache,  fever, chills, sweats, unintended wt loss or wt gain, classically pleuritic or exertional cp,  orthopnea pnd or arm/hand swelling  or leg swelling, presyncope, palpitations, abdominal pain, anorexia, nausea, vomiting, diarrhea  or change in bowel habits or change in bladder habits, change in stools or change in urine, dysuria, hematuria,  rash, arthralgias, visual complaints, headache, numbness, weakness or ataxia or problems with walking or coordination,  change in mood or  memory.        Current Meds  Medication Sig  . albuterol (PROVENTIL HFA;VENTOLIN HFA) 108 (90 Base) MCG/ACT inhaler Inhale 2 puffs into the lungs every 4 (four) hours as needed.  . ALPRAZolam (XANAX) 0.25 MG tablet Take 0.25 mg by mouth 3 (three) times daily.  Marland Kitchen  aspirin EC 81 MG tablet Take by mouth.  Marland Kitchen b complex vitamins capsule Take by mouth.  . Biotin 10000 MCG TABS Take 1 capsule by mouth daily.  Marland Kitchen CALCIUM PO Take 1 tablet by mouth daily.  . Cholecalciferol (VITAMIN D-1000 MAX ST) 25 MCG (1000 UT) tablet Take by mouth.  . fluticasone (FLONASE) 50 MCG/ACT nasal spray 1 spray by Each Nare route daily.  . Fluticasone-Umeclidin-Vilant 100-62.5-25 MCG/INH AEPB Inhale 1 puff into the lungs daily.  . furosemide (LASIX) 20 MG tablet Take 10 mg by mouth daily as needed.  . levalbuterol (XOPENEX) 0.63 MG/3ML nebulizer solution 1 vial 4 x daily as needed  . lisinopril-hydrochlorothiazide (PRINZIDE,ZESTORETIC) 10-12.5 MG tablet TAKE 1 TABLET BY MOUTH ONCE (1) DAILY  . Omega-3 1000 MG CAPS Take by mouth.  . OXYGEN 3lpm with sleep and then as needed during the day  [facility]  . pravastatin (PRAVACHOL) 20 MG tablet Take 20 mg by mouth daily.  . sertraline (ZOLOFT) 50 MG tablet Take by mouth.                 Objective:     amb wf nad   Wt Readings from Last 3 Encounters:  06/17/18 95 lb 12.8 oz (43.5 kg)  06/03/18 96 lb 9.6 oz (43.8 kg)  05/27/18 98 lb 9.6 oz (44.7 kg)     Vital signs reviewed - Note on arrival 02 sats  94% on 2lpm    Hoarse amb wf nad     HEENT: nl dentition / oropharynx. Nl external ear canals without cough reflex -  Mild bilateral non-specific turbinate edema     NECK :  without JVD/Nodes/TM/ nl carotid upstrokes bilaterally   LUNGS: no acc muscle use,  Mod barrel  contour chest wall with bilateral  Distant bs s audible wheeze and  without cough on insp or exp maneuver and mod  Hyperresonant  to  percussion bilaterally     CV:  RRR  no s3 or murmur or increase in P2, and no edema   ABD:  soft and nontender with pos mid insp Hoover's  in the supine position. No bruits or organomegaly appreciated, bowel sounds nl  MS:   Nl gait/  ext warm without deformities, calf tenderness, cyanosis or clubbing No obvious  joint restrictions   SKIN: warm and dry without lesions    NEURO:  alert, approp, nl sensorium with  no motor or cerebellar deficits apparent.         CXR PA and Lateral:   06/17/2018 :    I personally reviewed images and  impression as follows:   Severe hyperinflation with vertical heart/ non speicific markings s as dz  Assessment

## 2018-06-17 NOTE — Assessment & Plan Note (Signed)
Quit smoking 2007 - 02 dep 2015  - 06/03/2018  After extensive coaching inhaler device,  effectiveness =   75% - alpha one AT screen 06/03/2018  MM level 160  - Spirometry 06/17/2018  FEV1 0.6 (32%)  Ratio 0.50  p trelegy in am/ classic curvature  - 06/17/2018  After extensive coaching inhaler device,  effectiveness =    75% with hfa     Group D in terms of symptom/risk and laba/lama/ICS  therefore appropriate rx at this point so continue trelegy for now   Advised:  formulary restrictions will be an ongoing challenge for the forseable future and I would be happy to pick an alternative if the pt will first  provide me a list of them -  pt  will need to return here for training for any new device that is required eg dpi vs hfa vs respimat.    In the meantime we can always provide samples so that the patient never runs out of any needed respiratory medications.

## 2018-06-17 NOTE — Patient Instructions (Addendum)
Adjust your 02 to whatever you need to keep your saturation above 90%   Your lisinopril may cause tickle/ hoarseness/ cough that mimics copd / asthma   Please remember to go to the  x-ray department  for your tests - we will call you with the results when they are available    Please schedule a follow up office visit in 6 weeks, call sooner if needed

## 2018-06-17 NOTE — Assessment & Plan Note (Signed)
Likely beginning to have upper airway instability on combination of trelegy and acei but not severe enough to change it now so defer to PCP as ACE inhibitors are problematic in  pts with airway complaints because  even experienced pulmonologists can't always distinguish ace effects from copd/asthma.  By themselves they don't actually cause a problem, much like oxygen can't by itself start a fire, but they certainly serve as a powerful catalyst or enhancer for any "fire"  or inflammatory process in the upper airway, be it caused by an ET  tube or more commonly reflux (especially in the obese or pts with known GERD or who are on biphoshonates).    In the era of ARB near equivalency would consider  avoid ACEI  entirely in the short run and then decide later, having established a level of airway control using a reasonable limited regimen, whether to add back acei but even then being very careful to observe the pt for worsening airway control and number of meds used/ needed to control symptoms.     I had an extended discussion with the patient reviewing all relevant studies completed to date and  lasting 15 to 20 minutes of a 25 minute visit    See device teaching which extended face to face time for this visit.  Each maintenance medication was reviewed in detail including emphasizing most importantly the difference between maintenance and prns and under what circumstances the prns are to be triggered using an action plan format that is not reflected in the computer generated alphabetically organized AVS which I have not found useful in most complex patients, especially with respiratory illnesses  Please see AVS for specific instructions unique to this visit that I personally wrote and verbalized to the the pt in detail and then reviewed with pt  by my nurse highlighting any  changes in therapy recommended at today's visit to their plan of care.

## 2018-06-18 ENCOUNTER — Other Ambulatory Visit: Payer: Self-pay

## 2018-06-18 NOTE — Progress Notes (Signed)
Spoke with pt and notified of results per Dr. Wert. Pt verbalized understanding and denied any questions. 

## 2018-06-18 NOTE — Patient Outreach (Signed)
[facility]) Care Management  06/18/2018  Rachel Evans [birth year] [id number]   Telephone assessment: Referral date:05/04/18 Referral source:discharged from an inpatient admission from [facility] on 05/03/18. Insurance:[employer] team advantage  Telephone call to patient regarding telephone assessment follow up. HIPAA verified with patient. Patient states, "I am doing wonderful. I am back to my old normal or better."  Patient reports she saw her primary MD on yesterday and had a chest xray.  Patient reports her chest xray was normal no normal. Patient states her weight is still up and down.  She states she had a 4 lbs weight increase at her doctors visit. Patient states her appetite is stable.  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 Patient states she is combating the stress by decreasing friend/ family visits so that she can get her work done, taking her prescribed xanax, and taking frequent rest breaks when she is working.  Patient reports she has a follow up appointment with the cardiologist on 06/25/18. She reports her blood pressure is better. Patient reports she has a follow up appointment at the end of February with her primary MD.   Patient denies any further needs or concerns. She is in agreement that she has met her goals with [facility] care management services.  Patient confirms she has contact information for [facility] care management and has the 24 hour nurse advise line number.   PLAN; RNCM will close patient due to goals being met RNCM will send closure letter to patients primary MD   Quinn Plowman RN,BSN,CCM [facility] Telephonic  [phone]

## 2018-06-24 NOTE — Progress Notes (Signed)
Cardiology Office Note:    Date:  06/25/2018   ID:  Rachel Evans, DOB 1940/12/05, MRN [id number]  PCP:  Raina Mina., MD  Cardiologist:  Shirlee More, MD   Referring MD: Gweneth Fritter, FNP  ASSESSMENT:    1. Nonrheumatic mitral valve regurgitation   2. Essential hypertension   3. Demand ischemia (Rachel Evans)   4. Chronic obstructive pulmonary disease, unspecified COPD type ([place])    PLAN:    In order of problems listed above:  1. Mitral regurgitation is not severe she has no evidence of heart failure in my opinion did not have endocarditis.  There is a notation to consider an outpatient transesophageal echocardiogram and I just do not think there is no indication for it at this time.  In retrospect her bacteremia may have been contamination from skin.  Regardless she is finished antibiotics and has recovered and I would not pursue the diagnosis at this time. 2. Stable continue current treatment ACE inhibitor thiazide diuretic 3. I agree with the cardiologist who saw her at [facility] elevated troponin was due to acute medical illness no evidence of myocardial infarction.  Clinical question is whether she would benefit from ischemia evaluation she is asymptomatic has significant comorbidities of frailty and severe COPD would be a poor candidate for elective revascularization at this time I would not subject her to an ischemia evaluation.  If she had acute coronary syndrome coronary angiography would be the appropriate response.  In the interim continue treatment including aspirin and a low intensity statin.  She has had no angina. 4. Improved continue current treatment  Next appointment as needed   Medication Adjustments/Labs and Tests Ordered: Current medicines are reviewed at length with the patient today.  Concerns regarding medicines are outlined above.  Orders Placed This Encounter  Procedures  . EKG 12-Lead   No orders of the defined types were placed in this encounter.    Chief Complaint  Patient presents with  . Mitral Regurgitation  admitted to [facility] in December  History of Present Illness:    Rachel Evans is a 78 y.o. female with COPD who is being seen today for the evaluation of mitral regurgitation noted on echocardiogram during her recent hospitalization for exacerbation of COPD at the request of Goins, Gillis Santa, FNP.  I was unaware that she was seen by cardiologist we accessed her records from [facility] and she was admitted to the hospital with acute exacerbation of COPD marked hypoxemia elevated troponin 1.2 elevated lactic acid and was felt to have had demand ischemia and not acute myocardial infarction.  There was an elevated blood culture for skin staph she received a course of IV antibiotics and there is no discussion whether or not this was felt to be true bacteremia or skin contamination.  An echocardiogram was performed showed normal left ventricular function and mild mitral regurgitation.  She tells me there was some discussion of whether or not she had endo-carditis.  She received somewhere in the range of 2 to 3 weeks of antibiotics and since then is completely recovered no fever chills no change in her chronic shortness of breath edema palpitation or syncope.  She is pleased with the quality of her life.  The clinical question is whether troponin elevation was more than demand ischemia and whether she has significant mitral valve disease and/or endocarditis.  She relates previous attempts at ischemia evaluation were unsuccessful due to severe COPD.  She is quite affected limited by  her COPD and is short of breath even with ADLs uses oxygen continuously.   Past Medical History:  Diagnosis Date  . Anxiety   . Arthritis   . Cancer ([place])    colon cancer 2007  . Cataract   . Chest pain 12/03/2016  . Chronic obstructive pulmon disease w acute lower resp infct ([place]) 11/22/2015   [place].  . Chronic respiratory failure with hypoxia ([place]) 06/04/2018    02 dep since 2015 though still not needing at rest as recently as 04/2018   . COPD  pfts pending 06/03/2018   Quit smoking 2007 - 02 dep 2015  - 06/03/2018  After extensive coaching inhaler device,  effectiveness =   75% - alpha one AT screen 06/03/2018     . COPD (chronic obstructive pulmonary disease) ([place])    patient diagnosed in 2013  . COPD exacerbation ([place]) 05/10/2018  . Essential hypertension 11/22/2015   Mild permissive htn.  Orthostatic hypotension can occur.  . Hyperlipidemia 12/03/2016  . Hypertension   . Malaise and fatigue 11/22/2015  . Mitral valve disorder 06/11/2018  . Pneumonia   . Right thyroid nodule 06/11/2018  . Sepsis ([place])   . SOB (shortness of breath) 12/03/2016  . TIA (transient ischemic attack)     Past Surgical History:  Procedure Laterality Date  . ABDOMINAL HYSTERECTOMY    . COLECTOMY    . COLON SURGERY     colon resection  . TONSILLECTOMY      Current Medications: Current Meds  Medication Sig  . albuterol (PROVENTIL HFA;VENTOLIN HFA) 108 (90 Base) MCG/ACT inhaler Inhale 2 puffs into the lungs every 4 (four) hours as needed.  . ALPRAZolam (XANAX) 0.25 MG tablet Take 0.25 mg by mouth 3 (three) times daily.  Marland Kitchen aspirin EC 81 MG tablet Take 81 mg by mouth daily.   Marland Kitchen b complex vitamins capsule Take 1 capsule by mouth daily.   . Biotin 10000 MCG TABS Take 1 capsule by mouth daily.  Marland Kitchen CALCIUM PO Take 1 tablet by mouth daily.  . Cholecalciferol (VITAMIN D-1000 MAX ST) 25 MCG (1000 UT) tablet Take 1,000 Units by mouth daily.   . fluticasone (FLONASE) 50 MCG/ACT nasal spray Place 1 spray into both nostrils daily as needed.   . Fluticasone-Umeclidin-Vilant 100-62.5-25 MCG/INH AEPB Inhale 1 puff into the lungs daily.  . furosemide (LASIX) 20 MG tablet Take 10 mg by mouth daily as needed.  . levalbuterol (XOPENEX) 0.63 MG/3ML nebulizer solution 1 vial 4 x daily as needed  . lisinopril-hydrochlorothiazide (PRINZIDE,ZESTORETIC) 10-12.5 MG tablet TAKE 1 TABLET BY MOUTH  ONCE (1) DAILY  . Omega-3 1000 MG CAPS Take 1 capsule by mouth 2 (two) times daily.   . OXYGEN 3lpm with sleep and then as needed during the day  [facility]  . pravastatin (PRAVACHOL) 20 MG tablet Take 20 mg by mouth daily.  . sertraline (ZOLOFT) 50 MG tablet Take 50 mg by mouth daily.      Allergies:   Levofloxacin   Social History   Socioeconomic History  . Marital status: Married    Spouse name: Not on file  . Number of children: Not on file  . Years of education: Not on file  . Highest education level: Not on file  Occupational History  . Not on file  Social Needs  . Financial resource strain: Not on file  . Food insecurity:    Worry: Not on file    Inability: Not on file  . Transportation needs:  Medical: Not on file    Non-medical: Not on file  Tobacco Use  . Smoking status: Former Smoker    Packs/day: 1.50    Years: 40.00    Pack years: 60.00    Types: Cigarettes    Last attempt to quit: 05/18/2005    Years since quitting: 13.1  . Smokeless tobacco: Never Used  Substance and Sexual Activity  . Alcohol use: Yes    Comment: "very little"  . Drug use: Never  . Sexual activity: Not on file  Lifestyle  . Physical activity:    Days per week: Not on file    Minutes per session: Not on file  . Stress: Not on file  Relationships  . Social connections:    Talks on phone: Not on file    Gets together: Not on file    Attends religious service: Not on file    Active member of club or organization: Not on file    Attends meetings of clubs or organizations: Not on file    Relationship status: Not on file  Other Topics Concern  . Not on file  Social History Narrative  . Not on file     Family History: The patient's family history includes Arthritis in her mother; Cancer in her father; Depression in her mother; Heart disease in her brother and father; Hypertension in her brother, father, and mother.  ROS:   Review of Systems  Constitution: Positive for  malaise/fatigue and weight loss.  HENT: Negative.   Eyes: Negative.   Cardiovascular: Positive for dyspnea on exertion (any activity).  Respiratory: Positive for shortness of breath.   Endocrine: Negative.   Hematologic/Lymphatic: Negative.   Skin: Negative.   Musculoskeletal: Negative.   Gastrointestinal: Negative.   Genitourinary: Negative.   Neurological: Positive for weakness.  Psychiatric/Behavioral: Negative.   Allergic/Immunologic: Negative.    Please see the history of present illness.     All other systems reviewed and are negative.  EKGs/Labs/Other Studies Reviewed:    The following studies were reviewed today:  EKG:  EKG is  ordered today.  The ekg ordered today demonstrates sinus rhythm no ischemic changes  Recent Labs: 06/03/2018: Hemoglobin 12.8; Platelets 284.0  Recent Lipid Panel 12/28/2017 cholesterol 162 HDL 88 LDL 67 triglyceride 62 No results found for: CHOL, TRIG, HDL, CHOLHDL, VLDL, LDLCALC, LDLDIRECT  Physical Exam:    VS:  BP (!) 142/86 (BP Location: Left Arm, Patient Position: Sitting, Cuff Size: Small)   Pulse 69   Ht 5' 1.5" (1.562 m)   Wt 98 lb (44.5 kg)   SpO2 97%   BMI 18.22 kg/m     Wt Readings from Last 3 Encounters:  06/25/18 98 lb (44.5 kg)  06/17/18 95 lb 12.8 oz (43.5 kg)  06/03/18 96 lb 9.6 oz (43.8 kg)     GEN: She appears frail COPD habitus chronically ill poor muscle mass in no acute distress HEENT: Normal NECK: No JVD; No carotid bruits LYMPHATICS: No lymphadenopathy CARDIAC: She has a very unimpressive grade 1/6 murmur of mitral regurgitation at the apex no S3 no thrill S2 normal RRR, no murmurs, rubs, gallops RESPIRATORY:  Clear to auscultation without rales, wheezing or rhonchi  ABDOMEN: Soft, non-tender, non-distended MUSCULOSKELETAL:  No edema; No deformity  SKIN: Warm and dry NEUROLOGIC:  Alert and oriented x 3 PSYCHIATRIC:  Normal affect     Signed, Shirlee More, MD  06/25/2018 5:21 PM    [place]

## 2018-06-25 ENCOUNTER — Encounter: Payer: Self-pay | Admitting: Cardiology

## 2018-06-25 ENCOUNTER — Ambulatory Visit (INDEPENDENT_AMBULATORY_CARE_PROVIDER_SITE_OTHER): Payer: PPO | Admitting: Cardiology

## 2018-06-25 VITALS — BP 142/86 | HR 69 | Ht 61.5 in | Wt 98.0 lb

## 2018-06-25 DIAGNOSIS — I248 Other forms of acute ischemic heart disease: Secondary | ICD-10-CM | POA: Diagnosis not present

## 2018-06-25 DIAGNOSIS — J449 Chronic obstructive pulmonary disease, unspecified: Secondary | ICD-10-CM | POA: Diagnosis not present

## 2018-06-25 DIAGNOSIS — I10 Essential (primary) hypertension: Secondary | ICD-10-CM

## 2018-06-25 DIAGNOSIS — I340 Nonrheumatic mitral (valve) insufficiency: Secondary | ICD-10-CM

## 2018-06-25 NOTE — Patient Instructions (Signed)
Medication Instructions:  Your physician recommends that you continue on your current medications as directed. Please refer to the Current Medication list given to you today.  If you need a refill on your cardiac medications before your next appointment, please call your pharmacy.   Lab work: NONE  Testing/Procedures: An EKG was performed today  Follow-Up: At [facility], you and your health needs are our priority.  As part of our continuing mission to provide you with exceptional heart care, we have created designated Provider Care Teams.  These Care Teams include your primary Cardiologist (physician) and Advanced Practice Providers (APPs -  Physician Assistants and Nurse Practitioners) who all work together to provide you with the care you need, when you need it. You will need a follow up appointment in with Dr. Bettina Gavia, if needed.

## 2018-06-30 DIAGNOSIS — F339 Major depressive disorder, recurrent, unspecified: Secondary | ICD-10-CM | POA: Diagnosis not present

## 2018-06-30 DIAGNOSIS — E782 Mixed hyperlipidemia: Secondary | ICD-10-CM | POA: Diagnosis not present

## 2018-06-30 DIAGNOSIS — R5383 Other fatigue: Secondary | ICD-10-CM | POA: Diagnosis not present

## 2018-06-30 DIAGNOSIS — J449 Chronic obstructive pulmonary disease, unspecified: Secondary | ICD-10-CM | POA: Diagnosis not present

## 2018-06-30 DIAGNOSIS — Z79899 Other long term (current) drug therapy: Secondary | ICD-10-CM | POA: Diagnosis not present

## 2018-06-30 DIAGNOSIS — I10 Essential (primary) hypertension: Secondary | ICD-10-CM | POA: Diagnosis not present

## 2018-06-30 DIAGNOSIS — R5381 Other malaise: Secondary | ICD-10-CM | POA: Diagnosis not present

## 2018-06-30 DIAGNOSIS — D649 Anemia, unspecified: Secondary | ICD-10-CM | POA: Diagnosis not present

## 2018-07-15 DIAGNOSIS — J449 Chronic obstructive pulmonary disease, unspecified: Secondary | ICD-10-CM | POA: Diagnosis not present

## 2018-07-29 ENCOUNTER — Ambulatory Visit: Payer: PPO | Admitting: Internal Medicine

## 2018-08-15 DIAGNOSIS — J449 Chronic obstructive pulmonary disease, unspecified: Secondary | ICD-10-CM | POA: Diagnosis not present

## 2018-08-27 ENCOUNTER — Telehealth: Payer: Self-pay | Admitting: Internal Medicine

## 2018-08-27 MED ORDER — FLUTICASONE-UMECLIDIN-VILANT 100-62.5-25 MCG/INH IN AEPB: 1.0000 | INHALATION_SPRAY | Freq: Every day | RESPIRATORY_TRACT | 3 refills | Status: DC

## 2018-08-27 NOTE — Telephone Encounter (Signed)
Called and left detailed message for Patient on VM (DPR). Trelegy refill sent to requested pharmacy.  Nothing further at this time.  Per OV 06/17/18, MW   Group D in terms of symptom/risk and laba/lama/ICS  therefore appropriate rx at this point so continue trelegy for now   Advised:  formulary restrictions will be an ongoing challenge for the forseable future and I would be happy to pick an alternative if the pt will first  provide me a list of them -  pt  will need to return here for training for any new device that is required eg dpi vs hfa vs respimat.    In the meantime we can always provide samples so that the patient never runs out of any needed respiratory medications.

## 2018-09-14 DIAGNOSIS — J449 Chronic obstructive pulmonary disease, unspecified: Secondary | ICD-10-CM | POA: Diagnosis not present

## 2018-09-23 ENCOUNTER — Telehealth: Payer: Self-pay | Admitting: Internal Medicine

## 2018-09-23 NOTE — Telephone Encounter (Signed)
Pt has been scheduled for 02 recert 6/4 Nothing further needed.

## 2018-10-07 ENCOUNTER — Ambulatory Visit (INDEPENDENT_AMBULATORY_CARE_PROVIDER_SITE_OTHER): Payer: PPO | Admitting: Internal Medicine

## 2018-10-07 ENCOUNTER — Ambulatory Visit: Payer: PPO | Admitting: Internal Medicine

## 2018-10-07 ENCOUNTER — Other Ambulatory Visit: Payer: Self-pay

## 2018-10-07 ENCOUNTER — Encounter: Payer: Self-pay | Admitting: Internal Medicine

## 2018-10-07 DIAGNOSIS — J449 Chronic obstructive pulmonary disease, unspecified: Secondary | ICD-10-CM | POA: Diagnosis not present

## 2018-10-07 DIAGNOSIS — J9611 Chronic respiratory failure with hypoxia: Secondary | ICD-10-CM | POA: Diagnosis not present

## 2018-10-07 DIAGNOSIS — I10 Essential (primary) hypertension: Secondary | ICD-10-CM | POA: Diagnosis not present

## 2018-10-09 ENCOUNTER — Encounter: Payer: Self-pay | Admitting: Internal Medicine

## 2018-10-09 NOTE — Progress Notes (Signed)
Neysa Hotter, female    DOB: 07-May-1940,    MRN: [id number]   Brief patient profile:  75 yowf  MM quit smoking 2007 with doe to point where trouble steps but did not req 02 or meds but gradually worse and by  2015 rx= various inhalers and even 02 but didn't use consistently with doe = MMRC2   then acutely worse late Oct 2019 green sputum on trelegy rx levaquin > sore leg tendons so rx augmentin which helped mucus but breathing worse rx nebulizer xopenex then admitted 04/26/18 with "pna/sepsis"@ [place]  Floor bed > d/c May 03 2018 on IV ancef which was complete Jan 15th 2020 and referred to pulmonary clinic 06/03/2018 by Dr   Gilford Rile.with GOLD III criteria by spirometry 06/17/2018     History of Present Illness  06/03/2018  Pulmonary/ 1st office eval/Maricarmen Braziel  Chief Complaint  Patient presents with  . Pulmonary Consult    Referred by Dr. Gilford Rile. Pt states had PNA 04/26/18. She c/o SOB "sometimes"- occ gets winded walking room to room. She has occ cough with clear sputum.   Dyspnea:  MMRC3 = can't walk 100 yards even at a slow pace at a flat grade s stopping due to sob   Cough: clear /no am flares Sleep: on side hob on 1 pillow flat bed  SABA use: not using proair/ has neb with levoalbuterol / back on trelgy now / a bit confused when when / how to use saba  02  None at rest/ 3lpm hs and 3 pulsed  out Typically sats are maint  low 90s at rest RA and upper 80s on POC rec Plan A = Automatic = Trelegy one click daily  Plan B = Backup Only use your albuterol(proair) inhaler as a rescue medication  Work on inhaler technique:  Plan C = Crisis - only use your levoalbuterol nebulizer if you first try Plan B    06/17/2018  f/u ov/Ashby Leflore re: GOLD 3 / 02 dep hs and ex on trelegy maint  Chief Complaint  Patient presents with  . Follow-up    breathing has been worse over the past 2 days. Her voice has been hoarse but she is not coughing much. She rarely uses her albuterol inhaler or xopenex  neb.   Dyspnea:  Was able to cross parking lot into [employer] s 02 and shopped  Cough: dry raspy daytime aggravated by voice use (on ACEi)  Sleeping: on side flat bed/ one pillow  SABA use: none  02:  Sleeping on 2.5,  At rest 92%  RA and most she does is usually 4lpm rec Adjust your 02 to whatever you need to keep your saturation above 90%  Your lisinopril may cause tickle/ hoarseness/ cough that mimics copd / asthma    10/07/2018  f/u ov/Nickey Kloepfer re: copd GOLD III on trelegy and ACEi  Chief Complaint  Patient presents with  . Follow-up    COPD. She reports not having to use her rescue inhaler. She does get DOE.   Dyspnea: doe hc parking/ [employer] slowly = MMRC2 = can't walk a nl pace on a flat grade s sob but does fine slow and flat  Cough: dry daytime assoc with hoarseness  Sleeping: 2 pillow no problme SABA use: much less now  02: 2lpm hs and prn though not   monitoring sats as rec    No obvious day to day or daytime variability or assoc excess/ purulent sputum or mucus  plugs or hemoptysis or cp or chest tightness, subjective wheeze or overt sinus or hb symptoms.   Sleeping as above without nocturnal  or early am exacerbation  of respiratory  c/o's or need for noct saba. Also denies any obvious fluctuation of symptoms with weather or environmental changes or other aggravating or alleviating factors except as outlined above   No unusual exposure hx or h/o childhood pna/ asthma or knowledge of premature birth.  Current Allergies, Complete Past Medical History, Past Surgical History, Family History, and Social History were reviewed in [employer] record.  ROS  The following are not active complaints unless bolded Hoarseness, sore throat, dysphagia, dental problems, itching, sneezing,  nasal congestion or discharge of excess mucus or purulent secretions, ear ache,   fever, chills, sweats, unintended wt loss or wt gain, classically pleuritic or exertional cp,   orthopnea pnd or arm/hand swelling  or leg swelling, presyncope, palpitations, abdominal pain, anorexia, nausea, vomiting, diarrhea  or change in bowel habits or change in bladder habits, change in stools or change in urine, dysuria, hematuria,  rash, arthralgias, visual complaints, headache, numbness, weakness or ataxia or problems with walking or coordination,  change in mood or  memory.        Current Meds  Medication Sig  . albuterol (PROVENTIL HFA;VENTOLIN HFA) 108 (90 Base) MCG/ACT inhaler Inhale 2 puffs into the lungs every 4 (four) hours as needed.  Marland Kitchen aspirin EC 81 MG tablet Take 81 mg by mouth daily.   Marland Kitchen b complex vitamins capsule Take 1 capsule by mouth daily.   . Biotin 10000 MCG TABS Take 1 capsule by mouth daily.  Marland Kitchen CALCIUM PO Take 1 tablet by mouth daily.  . Cholecalciferol (VITAMIN D-1000 MAX ST) 25 MCG (1000 UT) tablet Take 1,000 Units by mouth daily.   . fluticasone (FLONASE) 50 MCG/ACT nasal spray Place 1 spray into both nostrils daily as needed.   . Fluticasone-Umeclidin-Vilant 100-62.5-25 MCG/INH AEPB Inhale 1 puff into the lungs daily.  . furosemide (LASIX) 20 MG tablet Take 10 mg by mouth daily as needed.  . levalbuterol (XOPENEX) 0.63 MG/3ML nebulizer solution 1 vial 4 x daily as needed  . lisinopril-hydrochlorothiazide (PRINZIDE,ZESTORETIC) 10-12.5 MG tablet TAKE 1 TABLET BY MOUTH ONCE (1) DAILY  . Omega-3 1000 MG CAPS Take 1 capsule by mouth 2 (two) times daily.   . OXYGEN 3lpm with sleep and then as needed during the day  [facility]  . pravastatin (PRAVACHOL) 20 MG tablet Take 20 mg by mouth daily.  . sertraline (ZOLOFT) 50 MG tablet Take 50 mg by mouth daily.   . [DISCONTINUED] ALPRAZolam (XANAX) 0.25 MG tablet Take 0.25 mg by mouth 3 (three) times daily.                      Objective:     Mod hoarse amb wf nad   10/07/2018          97   06/17/18 95 lb 12.8 oz (43.5 kg)  06/03/18 96 lb 9.6 oz (43.8 kg)  05/27/18 98 lb 9.6 oz (44.7 kg)      Vital signs reviewed - Note on arrival 02 sats  94% on RA      HEENT: nl dentition / oropharynx. Nl external ear canals without cough reflex -  Mild bilateral non-specific turbinate edema     NECK :  without JVD/Nodes/TM/ nl carotid upstrokes bilaterally   LUNGS: no acc muscle use,  Mod barrel  contour chest  wall with bilateral  Distant bs s audible wheeze and  without cough on insp or exp maneuver and mod  Hyperresonant  to  percussion bilaterally     CV:  RRR  no s3 or murmur or increase in P2, and no edema   ABD:  soft and nontender with pos late  insp Hoover's  in the supine position. No bruits or organomegaly appreciated, bowel sounds nl  MS:   Nl gait/  ext warm without deformities, calf tenderness, cyanosis or clubbing No obvious joint restrictions   SKIN: warm and dry without lesions    NEURO:  alert, approp, nl sensorium with  no motor or cerebellar deficits apparent.          Assessment

## 2018-10-09 NOTE — Assessment & Plan Note (Addendum)
Quit smoking 2007 - 02 dep 2015  - alpha one AT screen 06/03/2018  MM level 160 - Spirometry 06/17/2018  FEV1 0.6 (32%)  Ratio 0.50  p trelegy in am/ classic curvature   - 10/07/2018  After extensive coaching inhaler device,  effectiveness =    90% with dpi / 75% hfa    Group D in terms of symptom/risk and laba/lama/ICS  therefore appropriate rx at this point >>>  Continue trelegy but be sure to rinse and gargle to minimize the irritating  impact on upper airway which is "fanned" but using ACEi (see separate a/p) concomitantly

## 2018-10-09 NOTE — Assessment & Plan Note (Signed)
In the best review of chronic cough to date ( NEJM 2016 375 [phone]) ,  ACEi are now felt to cause cough in up to  20% of pts which is a 4 fold increase from previous reports and does not include the variety of non-specific complaints we see in pulmonary clinic in pts on ACEi but previously attributed to another dx like  Copd/asthma and  include PNDS, throat and chest congestion, "bronchitis", unexplained dyspnea and noct "strangling" sensations, and hoarseness, but also  atypical /refractory GERD symptoms like dysphagia and "bad heartburn"   The only way I know  to prove this is not an "ACEi Case" is a trial off ACEi x a minimum of 6 weeks then regroup > pt advised, defer to next visit with pcp as not an acute problem but an issue going forward with interpretation of non-specific upper resp symptoms     I had an extended discussion with the patient reviewing all relevant studies completed to date and  lasting 15 to 20 minutes of a 25 minute visit    See device teaching which extended face to face time for this visit.  Each maintenance medication was reviewed in detail including emphasizing most importantly the difference between maintenance and prns and under what circumstances the prns are to be triggered using an action plan format that is not reflected in the computer generated alphabetically organized AVS which I have not found useful in most complex patients, especially with respiratory illnesses  Please see AVS for specific instructions unique to this visit that I personally wrote and verbalized to the the pt in detail and then reviewed with pt  by my nurse highlighting any  changes in therapy recommended at today's visit to their plan of care.

## 2018-10-09 NOTE — Patient Instructions (Signed)
Consider changing zestoretic to any number of suitable alternatives that don't contain ACE inhibitors which tend to mimic all your freq copd symptoms and make it difficult to sort out where exactly they are coming from    Please schedule a follow up visit in 6  months but call sooner if needed

## 2018-10-09 NOTE — Assessment & Plan Note (Signed)
02 dep since 2015 though still not needing at rest as recently as 04/2018  - no longer needing at rest as of 06/17/2018   Rec: 2lpm hs and as needed during the day to keep sats > 90% with activity

## 2018-10-15 DIAGNOSIS — J449 Chronic obstructive pulmonary disease, unspecified: Secondary | ICD-10-CM | POA: Diagnosis not present

## 2018-11-14 DIAGNOSIS — J449 Chronic obstructive pulmonary disease, unspecified: Secondary | ICD-10-CM | POA: Diagnosis not present

## 2018-12-15 DIAGNOSIS — J449 Chronic obstructive pulmonary disease, unspecified: Secondary | ICD-10-CM | POA: Diagnosis not present

## 2018-12-31 DIAGNOSIS — E782 Mixed hyperlipidemia: Secondary | ICD-10-CM | POA: Diagnosis not present

## 2018-12-31 DIAGNOSIS — Z0000 Encounter for general adult medical examination without abnormal findings: Secondary | ICD-10-CM | POA: Diagnosis not present

## 2018-12-31 DIAGNOSIS — I10 Essential (primary) hypertension: Secondary | ICD-10-CM | POA: Diagnosis not present

## 2018-12-31 DIAGNOSIS — F339 Major depressive disorder, recurrent, unspecified: Secondary | ICD-10-CM | POA: Diagnosis not present

## 2018-12-31 DIAGNOSIS — R5383 Other fatigue: Secondary | ICD-10-CM | POA: Diagnosis not present

## 2018-12-31 DIAGNOSIS — R5381 Other malaise: Secondary | ICD-10-CM | POA: Diagnosis not present

## 2018-12-31 DIAGNOSIS — Z23 Encounter for immunization: Secondary | ICD-10-CM | POA: Diagnosis not present

## 2018-12-31 DIAGNOSIS — J449 Chronic obstructive pulmonary disease, unspecified: Secondary | ICD-10-CM | POA: Diagnosis not present

## 2018-12-31 DIAGNOSIS — M8580 Other specified disorders of bone density and structure, unspecified site: Secondary | ICD-10-CM | POA: Diagnosis not present

## 2019-01-11 ENCOUNTER — Other Ambulatory Visit: Payer: Self-pay | Admitting: Internal Medicine

## 2019-01-11 MED ORDER — FLUTICASONE-UMECLIDIN-VILANT 100-62.5-25 MCG/INH IN AEPB: 1.0000 | INHALATION_SPRAY | Freq: Every day | RESPIRATORY_TRACT | 11 refills | Status: DC

## 2019-01-15 DIAGNOSIS — J449 Chronic obstructive pulmonary disease, unspecified: Secondary | ICD-10-CM | POA: Diagnosis not present

## 2019-02-14 DIAGNOSIS — J449 Chronic obstructive pulmonary disease, unspecified: Secondary | ICD-10-CM | POA: Diagnosis not present

## 2019-02-16 ENCOUNTER — Telehealth: Payer: Self-pay | Admitting: Internal Medicine

## 2019-02-16 ENCOUNTER — Encounter: Payer: Self-pay | Admitting: Internal Medicine

## 2019-02-16 ENCOUNTER — Ambulatory Visit (INDEPENDENT_AMBULATORY_CARE_PROVIDER_SITE_OTHER): Payer: PPO | Admitting: Internal Medicine

## 2019-02-16 ENCOUNTER — Other Ambulatory Visit: Payer: Self-pay

## 2019-02-16 DIAGNOSIS — J449 Chronic obstructive pulmonary disease, unspecified: Secondary | ICD-10-CM | POA: Diagnosis not present

## 2019-02-16 DIAGNOSIS — I10 Essential (primary) hypertension: Secondary | ICD-10-CM

## 2019-02-16 DIAGNOSIS — J9611 Chronic respiratory failure with hypoxia: Secondary | ICD-10-CM

## 2019-02-16 MED ORDER — TELMISARTAN-HCTZ 40-12.5 MG PO TABS: 1.0000 | ORAL_TABLET | Freq: Every day | ORAL | 11 refills | Status: DC

## 2019-02-16 MED ORDER — AZITHROMYCIN 250 MG PO TABS: ORAL_TABLET | ORAL | 0 refills | Status: DC

## 2019-02-16 MED ORDER — PREDNISONE 10 MG PO TABS: ORAL_TABLET | ORAL | 0 refills | Status: DC

## 2019-02-16 NOTE — Assessment & Plan Note (Signed)
Quit smoking 2007 - 02 dep 2015  - alpha one AT screen 06/03/2018  MM level 160 - Spirometry 06/17/2018  FEV1 0.6 (32%)  Ratio 0.50  p trelegy in am/ classic curvature   - 10/07/2018  After extensive coaching inhaler device,  effectiveness =    90% with dpi / 75% hfa    ? Mod aecopd vs ace case   Rx Prednisone 10 mg take  4 each am x 2 days,   2 each am x 2 days,  1 each am x 2 days and stop/ Zpak No change in maint or prn saba ([facility] PLAN REVIEWED)

## 2019-02-16 NOTE — Assessment & Plan Note (Signed)
02 dep since 2015 though still not needing at rest as recently as 04/2018  - no longer needing at rest as of 06/17/2018   Despite flare sleeping and amb ok on o2, no change in rx needed

## 2019-02-16 NOTE — Assessment & Plan Note (Signed)
D/c acei 02/16/2019 due to cough   In the best review of chronic cough to date ( NEJM 2016 375 [id number]) ,  ACEi are now felt to cause cough in up to  20% of pts which is a 4 fold increase from previous reports and does not include the variety of non-specific complaints we see in pulmonary clinic in pts on ACEi but previously attributed to another dx like  Copd/asthma and  include PNDS, throat and chest congestion, "bronchitis", unexplained dyspnea and noct "strangling" sensations, and hoarseness, but also  atypical /refractory GERD symptoms like dysphagia and "bad heartburn"   The only way I know  to prove this is not an "ACEi Case" is a trial off ACEi x a minimum of 4 weeks then regroup.    >>> try micardis 40-12.5 mg one daily    Each maintenance medication was reviewed in detail including most importantly the difference between maintenance and as needed and under what circumstances the prns are to be used.  Please see AVS for specific  Instructions which are unique to this visit and I personally typed out  which were reviewed in detail over the phone with the patient and a copy provided - will try to set up MyChart

## 2019-02-16 NOTE — Telephone Encounter (Signed)
Called and spoke to patient. Patient reported increased shortness of breath and productive cough -clear to thick white. Patient states she has been taking mucinex and wearing O2, 2-3L and per patient saturations are 92-94%. Since patient does have symptoms her visit will be a telephone visit. Nothing further needed at this time.

## 2019-02-16 NOTE — Progress Notes (Signed)
Rachel Evans, female    DOB: Feb 18, 1941,    MRN: [id number]   Brief patient profile:  9 yowf  MM quit smoking 2007 with doe to point where trouble steps but did not req 02 or meds but gradually worse and by  2015 rx= various inhalers and even 02 but didn't use consistently with doe = MMRC2   then acutely worse late Oct 2019 green sputum on trelegy rx levaquin > sore leg tendons so rx augmentin which helped mucus but breathing worse rx nebulizer xopenex then admitted 04/26/18 with "pna/sepsis"@ [place]  Floor bed > d/c May 03 2018 on IV ancef which was complete Jan 15th 2020 and referred to pulmonary clinic 06/03/2018 by Dr   Gilford Rile.with GOLD III criteria by spirometry 06/17/2018     History of Present Illness  06/03/2018  Pulmonary/ 1st office eval/Jowel Waltner  Chief Complaint  Patient presents with  . Pulmonary Consult    Referred by Dr. Gilford Rile. Pt states had PNA 04/26/18. She c/o SOB "sometimes"- occ gets winded walking room to room. She has occ cough with clear sputum.   Dyspnea:  MMRC3 = can't walk 100 yards even at a slow pace at a flat grade s stopping due to sob   Cough: clear /no am flares Sleep: on side hob on 1 pillow flat bed  SABA use: not using proair/ has neb with levoalbuterol / back on trelgy now / a bit confused when when / how to use saba  02  None at rest/ 3lpm hs and 3 pulsed  out Typically sats are maint  low 90s at rest RA and upper 80s on POC rec Plan A = Automatic = Trelegy one click daily  Plan B = Backup Only use your albuterol(proair) inhaler as a rescue medication  Work on inhaler technique:  Plan C = Crisis - only use your levoalbuterol nebulizer if you first try Plan B    06/17/2018  f/u ov/Maribelle Hopple re: GOLD 3 / 02 dep hs and ex on trelegy maint  Chief Complaint  Patient presents with  . Follow-up    breathing has been worse over the past 2 days. Her voice has been hoarse but she is not coughing much. She rarely uses her albuterol inhaler or xopenex  neb.   Dyspnea:  Was able to cross parking lot into [employer] s 02 and shopped  Cough: dry raspy daytime aggravated by voice use (on ACEi)  Sleeping: on side flat bed/ one pillow  SABA use: none  02:  Sleeping on 2.5,  At rest 92%  RA and most she does is usually 4lpm rec Adjust your 02 to whatever you need to keep your saturation above 90%  Your lisinopril may cause tickle/ hoarseness/ cough that mimics copd / asthma    10/07/2018  f/u ov/Krystopher Kuenzel re: copd GOLD III on trelegy and ACEi  Chief Complaint  Patient presents with  . Follow-up    COPD. She reports not having to use her rescue inhaler. She does get DOE.   Dyspnea: doe hc parking/ [employer] slowly = MMRC2 = can't walk a nl pace on a flat grade s sob but does fine slow and flat  Cough: dry daytime assoc with hoarseness  Sleeping: 2 pillow no problem SABA use: much less now  02: 2lpm hs and prn though not   monitoring sats as rec  rec Consider changing zestoretic to any number of suitable alternatives that don't contain ACE inhibitors which tend  to mimic all your freq copd symptoms and make it difficult to sort out where exactly they are coming from    Virtual Visit via Telephone Note 02/16/2019 worse x 3weeks   I connected with Rachel Evans on 02/16/19 at  1:20  PM EDT by telephone and verified that I am speaking with the correct person using two identifiers.   I discussed the limitations, risks, security and privacy concerns of performing an evaluation and management service by telephone and the availability of in person appointments. I also discussed with the patient that there may be a patient responsible charge related to this service. The patient expressed understanding and agreed to proceed.   History of Present Illness: Dyspnea:  50 ft  Cough: severe feel like mucus stuck  Sleeping: no respiratory symptoms SABA use: not tried 02: 2lpm  Hs ,  2-4lpm with activity,  At rest 92%   No obvious day to day or daytime  variability or assoc excess/ purulent sputum or mucus plugs or hemoptysis or cp or chest tightness, subjective wheeze or overt sinus or hb symptoms.    Also denies any obvious fluctuation of symptoms with weather or environmental changes or other aggravating or alleviating factors except as outlined above.   Meds reviewed/ med reconciliation completed       Observations/Objective: Sounds fine, full sentences/ no stridor or rattling    Assessment and Plan: See problem list for active a/p's   Follow Up Instructions: See avs for instructions unique to this ov which includes revised/ updated med list     I discussed the assessment and treatment plan with the patient. The patient was provided an opportunity to ask questions and all were answered. The patient agreed with the plan and demonstrated an understanding of the instructions.   The patient was advised to call back or seek an in-person evaluation if the symptoms worsen or if the condition fails to improve as anticipated.  I provided 25  minutes of non-face-to-face time during this encounter.   Christinia Gully, MD

## 2019-02-16 NOTE — Patient Instructions (Signed)
Stop zestoretic  And start micardis 40-12.5 one daily in its place  Prednisone 10 mg take  4 each am x 2 days,   2 each am x 2 days,  1 each am x 2 days and stop   zpak   Plan A = Automatic = Always=    Continue trelegy one click each am   Plan B = Backup (to supplement plan A, not to replace it) Only use your albuterol inhaler as a rescue medication to be used if you can't catch your breath by resting or doing a relaxed purse lip breathing pattern.  - The less you use it, the better it will work when you need it. - Ok to use the inhaler up to 2 puffs  every 4 hours if you must but call for appointment if use goes up over your usual need - Don't leave home without it !!  (think of it like the spare tire for your car)   Plan C = Crisis (instead of Plan B but only if Plan B stops working) - only use your levoalbuterol nebulizer if you first try Plan B and it fails to help > ok to use the nebulizer up to every 4 hours but if start needing it regularly call for immediate appointment     Please schedule a follow up office visit in 4 weeks, sooner if needed

## 2019-02-25 ENCOUNTER — Other Ambulatory Visit: Payer: Self-pay

## 2019-02-25 ENCOUNTER — Encounter: Payer: Self-pay | Admitting: Primary Care

## 2019-02-25 ENCOUNTER — Ambulatory Visit (INDEPENDENT_AMBULATORY_CARE_PROVIDER_SITE_OTHER): Payer: PPO

## 2019-02-25 ENCOUNTER — Telehealth: Payer: Self-pay | Admitting: Internal Medicine

## 2019-02-25 ENCOUNTER — Ambulatory Visit (INDEPENDENT_AMBULATORY_CARE_PROVIDER_SITE_OTHER): Payer: PPO | Admitting: Primary Care

## 2019-02-25 VITALS — BP 118/84 | HR 76 | Temp 97.1°F | Ht 61.0 in | Wt 100.8 lb

## 2019-02-25 DIAGNOSIS — J441 Chronic obstructive pulmonary disease with (acute) exacerbation: Secondary | ICD-10-CM | POA: Diagnosis not present

## 2019-02-25 DIAGNOSIS — Z20822 Contact with and (suspected) exposure to covid-19: Secondary | ICD-10-CM

## 2019-02-25 DIAGNOSIS — J9611 Chronic respiratory failure with hypoxia: Secondary | ICD-10-CM

## 2019-02-25 DIAGNOSIS — Z20828 Contact with and (suspected) exposure to other viral communicable diseases: Secondary | ICD-10-CM | POA: Diagnosis not present

## 2019-02-25 MED ORDER — METHYLPREDNISOLONE ACETATE 80 MG/ML IJ SUSP: 60.0000 mg | Freq: Once | INTRAMUSCULAR | Status: DC

## 2019-02-25 NOTE — Telephone Encounter (Signed)
Spoke with patient.  Dr. Melvyn Novas is full today so made in office visit with BW at [phone] as patient needs at least 45 minutes to an hour to get here.  Nothing further needed at this time

## 2019-02-25 NOTE — Telephone Encounter (Signed)
Agree needs to be seen asap with all meds in hand

## 2019-02-25 NOTE — Patient Instructions (Signed)
Pleasure meeting you today, sorry you are not feeling well   Recommendations: Continue Trelegy 1 puff daily Try using Xopenex nebulizer every 6 hours  Take mucinex twice daily for congestion  Use 3-4L pulsed oxygen on exertion  Office treatment: Depo-medrol 60mg  IM  RX: Prednisone taper (40mg  x 3 days; 30mg  x 3 days; 20mg  x 3 days; 10mg  x 3 days)  Orders: CXR today re: COPD exacerbation COVID screening   Follow-up: 1-2 weeks with Eustaquio Maize NP or Dr. Melvyn Novas

## 2019-02-25 NOTE — Assessment & Plan Note (Signed)
-   O2 desaturated 84% 2L pulsed on exertion, recovered to 95% with rest - Needs to use 3-4 L on exertion

## 2019-02-25 NOTE — Progress Notes (Signed)
@Patient  ID: Rachel Evans, female    DOB: [birth year], 78 y.o.   MRN: [id number]  Chief Complaint  Patient presents with  . Acute Visit    Reports she completed the prednisone and antibiotic and she feels very SOB. Requesting a CXR. Using 2L pulsed 83% on walk back, recovered to 96% at rest.     Referring provider: Raina Mina., MD  HPI: 78 year old female, former smoker quit in 2007. PMH significant for COPD GOLD III, chronic respiratory failure with hypoxia, hypertension, mitral valve disorder, hyperlipidemia. Patient of Dr. Melvyn Novas, last seen on 02/16/19 for televisit and treated for COPD exacerbation with zpack and prednisone taper. Maintained on Trelegy and albuterol hfa/levalbuerol nebulizer.   02/25/2019 Patient presents today for acute visit. Reports of persistent increased shortness of breath, wheezing and chest tightness. Associated cough with some clear mucus production. Symptoms have been on-going for 6 weeks. No improvement with azithromycin. She has had to increase oxygen to 3-4 L pulsed on exertion. O2 83% 2L pulsed walking back to exam room, recovered to 96% at rest. Continues Trelegy 1 puff daily, she has not tried using Levalbuterol nebulizer.  Denies sick contact, fever, chills, N/V.    Allergies  Allergen Reactions  . Levofloxacin Other (See Comments)    Leg pain and hardness    Immunization History  Administered Date(s) Administered  . Influenza, Quadrivalent, Recombinant, Inj, Pf 12/31/2018  . Influenza,inj,Quad PF,6+ Mos 01/16/2015  . Influenza-Unspecified 02/06/2017, 02/04/2018  . Pneumococcal Conjugate-13 03/05/2013  . Pneumococcal Polysaccharide-23 05/06/2007, 11/27/2016  . Tetanus 05/06/2007  . Zoster 05/06/2007  . Zoster Recombinat (Shingrix) 11/06/2016    Past Medical History:  Diagnosis Date  . Anxiety   . Arthritis   . Cancer ([place])    colon cancer 2007  . Cataract   . Chest pain 12/03/2016  . Chronic obstructive pulmon disease w acute lower  resp infct ([place]) 11/22/2015   [place].  . Chronic respiratory failure with hypoxia ([place]) 06/04/2018   02 dep since 2015 though still not needing at rest as recently as 04/2018   . COPD  pfts pending 06/03/2018   Quit smoking 2007 - 02 dep 2015  - 06/03/2018  After extensive coaching inhaler device,  effectiveness =   75% - alpha one AT screen 06/03/2018     . COPD (chronic obstructive pulmonary disease) ([place])    patient diagnosed in 2013  . COPD exacerbation ([place]) 05/10/2018  . Essential hypertension 11/22/2015   Mild permissive htn.  Orthostatic hypotension can occur.  . Hyperlipidemia 12/03/2016  . Hypertension   . Malaise and fatigue 11/22/2015  . Mitral valve disorder 06/11/2018  . Pneumonia   . Right thyroid nodule 06/11/2018  . Sepsis ([place])   . SOB (shortness of breath) 12/03/2016  . TIA (transient ischemic attack)     Tobacco History: Social History   Tobacco Use  Smoking Status Former Smoker  . Packs/day: 1.50  . Years: 40.00  . Pack years: 60.00  . Types: Cigarettes  . Quit date: 05/18/2005  . Years since quitting: 13.7  Smokeless Tobacco Never Used   Counseling given: Not Answered   Outpatient Medications Prior to Visit  Medication Sig Dispense Refill  . albuterol (PROVENTIL HFA;VENTOLIN HFA) 108 (90 Base) MCG/ACT inhaler Inhale 2 puffs into the lungs every 4 (four) hours as needed.    Marland Kitchen aspirin EC 81 MG tablet Take 81 mg by mouth daily.     Marland Kitchen b complex vitamins capsule Take 1 capsule  by mouth daily.     . Biotin 10000 MCG TABS Take 1 capsule by mouth daily.    Marland Kitchen CALCIUM PO Take 1 tablet by mouth daily.    . Cholecalciferol (VITAMIN D-1000 MAX ST) 25 MCG (1000 UT) tablet Take 1,000 Units by mouth daily.     . fluticasone (FLONASE) 50 MCG/ACT nasal spray Place 1 spray into both nostrils daily as needed.     . Fluticasone-Umeclidin-Vilant 100-62.5-25 MCG/INH AEPB Inhale 1 puff into the lungs daily. 60 each 11  . furosemide (LASIX) 20 MG tablet Take 10 mg by mouth daily as needed.     . levalbuterol (XOPENEX) 0.63 MG/3ML nebulizer solution 1 vial 4 x daily as needed    . Omega-3 1000 MG CAPS Take 1 capsule by mouth 2 (two) times daily.     . OXYGEN 3lpm with sleep and then as needed during the day  [facility]    . pravastatin (PRAVACHOL) 20 MG tablet Take 20 mg by mouth daily.    . sertraline (ZOLOFT) 50 MG tablet Take 50 mg by mouth daily.     Marland Kitchen telmisartan-hydrochlorothiazide (MICARDIS HCT) 40-12.5 MG tablet Take 1 tablet by mouth daily. 30 tablet 11  . azithromycin (ZITHROMAX) 250 MG tablet Take 2 on day one then 1 daily x 4 days (Patient not taking: Reported on 02/25/2019) 6 tablet 0  . predniSONE (DELTASONE) 10 MG tablet Take  4 each am x 2 days,   2 each am x 2 days,  1 each am x 2 days and stop (Patient not taking: Reported on 02/25/2019) 14 tablet 0   No facility-administered medications prior to visit.    Review of Systems  Review of Systems  Constitutional: Positive for fatigue.  HENT: Negative.   Respiratory: Positive for chest tightness, shortness of breath and wheezing.   Cardiovascular: Negative.    Physical Exam  BP 118/84   Pulse 76   Temp (!) 97.1 F (36.2 C) (Temporal)   Ht 5\' 1"  (1.549 m)   Wt 100 lb 12.8 oz (45.7 kg)   SpO2 96%   BMI 19.05 kg/m  Physical Exam Constitutional:      General: She is not in acute distress.    Appearance: Normal appearance. She is not ill-appearing.     Comments: Underweight  HENT:     Head: Normocephalic and atraumatic.     Mouth/Throat:     Comments: Deferred d/t masking Cardiovascular:     Rate and Rhythm: Normal rate.     Comments: No edema Pulmonary:     Breath sounds: No stridor. Wheezing present.     Comments: Diminished right base; labored breathing and wheezing on exertion Skin:    General: Skin is warm and dry.  Neurological:     General: No focal deficit present.     Mental Status: She is alert and oriented to person, place, and time. Mental status is at baseline.  Psychiatric:         Mood and Affect: Mood normal.        Behavior: Behavior normal.        Thought Content: Thought content normal.        Judgment: Judgment normal.     Lab Results:  CBC    Component Value Date/Time   WBC 5.6 06/03/2018 1428   RBC 4.05 06/03/2018 1428   HGB 12.8 06/03/2018 1428   HCT 38.2 06/03/2018 1428   PLT 284.0 06/03/2018 1428   MCV 94.4 06/03/2018 1428  MCHC 33.5 06/03/2018 1428   RDW 14.3 06/03/2018 1428   LYMPHSABS 0.9 06/03/2018 1428   MONOABS 0.5 06/03/2018 1428   EOSABS 0.1 06/03/2018 1428   BASOSABS 0.0 06/03/2018 1428    BMET No results found for: NA, K, CL, CO2, GLUCOSE, BUN, CREATININE, CALCIUM, GFRNONAA, GFRAA  BNP No results found for: BNP  ProBNP No results found for: PROBNP  Imaging: No results found.   Assessment & Plan:   COPD exacerbation ([place]) - Persistent shortness of breath with associated productive cough x4-6 weeks - Lung sounds diminished right base, labored breathing/wheezing on exertion - Received 60mg  depo-medrol IM injection today - RX prednsione taper (holding off on further abx until CXR resulted) - Continue Trelegy 1 puff daily - Recommend using Xopenex nebulizer every 6 hours  - Take mucinex twice daily for congestion  - CXR today and Covid screening  - FU in 1-2 weeks with Dr. Melvyn Novas or Eustaquio Maize NP  Chronic respiratory failure with hypoxia ([place]) - O2 desaturated 84% 2L pulsed on exertion, recovered to 95% with rest - Needs to use 3-4 L on exertion    Martyn Ehrich, NP 02/25/2019

## 2019-02-25 NOTE — Progress Notes (Signed)
CXR showed no evidence of pneumonia, pleural effusion or pneumothorax. COPD with advance emphysema.

## 2019-02-25 NOTE — Telephone Encounter (Signed)
Called and spoke to patient. Patient stated that she finished the zpak and prednisone early this week and continues not to feel well. Patient stated she is having to wear her oxygen at all times/any activity and that is more than she is used to. Patient stated that she has had increased shortness of breath and also a productive cough with thick, white phlegm. Patient would like an in person visit if possible.  Patient denies being around around anyone suspected of covid and denies any other symptoms.  Dr. Melvyn Novas please advise. Thank you!

## 2019-02-25 NOTE — Assessment & Plan Note (Addendum)
-   Persistent shortness of breath with associated productive cough x4-6 weeks - Lung sounds diminished right base, labored breathing/wheezing on exertion - Received 60mg  depo-medrol IM injection today - RX prednsione taper (holding off on further abx until CXR resulted) - Continue Trelegy 1 puff daily - Recommend using Xopenex nebulizer every 6 hours  - Take mucinex twice daily for congestion  - CXR today and Covid screening  - FU in 1-2 weeks with Dr. Melvyn Novas or Eustaquio Maize NP

## 2019-02-27 LAB — NOVEL CORONAVIRUS, NAA: SARS-CoV-2, NAA: NOT DETECTED

## 2019-02-28 ENCOUNTER — Telehealth: Payer: Self-pay | Admitting: Primary Care

## 2019-02-28 MED ORDER — PREDNISONE 10 MG PO TABS: ORAL_TABLET | ORAL | 0 refills | Status: DC

## 2019-02-28 NOTE — Telephone Encounter (Signed)
Spoke with pt. States that she was to have a prednisone taper sent in on 02/25/2019 after her visit with [facility]. Per Beth's AVS instructions, pt was to have a prednisone taper >> 40mg  x 3 days; 30mg  x 3 days; 20mg  x 3 days; 10mg  x 3 days. I apologized to the pt that this was not taken care of. Rx has been sent in. Nothing further was needed.

## 2019-03-03 NOTE — Progress Notes (Signed)
Chart and office note reviewed in detail  > agree with a/p as outlined    

## 2019-03-11 ENCOUNTER — Ambulatory Visit: Payer: PPO | Admitting: Internal Medicine

## 2019-03-11 ENCOUNTER — Other Ambulatory Visit: Payer: Self-pay

## 2019-03-11 ENCOUNTER — Encounter: Payer: Self-pay | Admitting: Internal Medicine

## 2019-03-11 DIAGNOSIS — J449 Chronic obstructive pulmonary disease, unspecified: Secondary | ICD-10-CM | POA: Diagnosis not present

## 2019-03-11 DIAGNOSIS — J9611 Chronic respiratory failure with hypoxia: Secondary | ICD-10-CM | POA: Diagnosis not present

## 2019-03-11 DIAGNOSIS — E871 Hypo-osmolality and hyponatremia: Secondary | ICD-10-CM

## 2019-03-11 DIAGNOSIS — I10 Essential (primary) hypertension: Secondary | ICD-10-CM

## 2019-03-11 LAB — CBC WITH DIFFERENTIAL/PLATELET
Basophils Absolute: 0 10*3/uL (ref 0.0–0.1)
Basophils Relative: 0.2 % (ref 0.0–3.0)
Eosinophils Absolute: 0 10*3/uL (ref 0.0–0.7)
Eosinophils Relative: 0.1 % (ref 0.0–5.0)
HCT: 42.4 % (ref 36.0–46.0)
Hemoglobin: 14.4 g/dL (ref 12.0–15.0)
Lymphocytes Relative: 6.2 % — ABNORMAL LOW (ref 12.0–46.0)
Lymphs Abs: 0.6 10*3/uL — ABNORMAL LOW (ref 0.7–4.0)
MCHC: 33.9 g/dL (ref 30.0–36.0)
MCV: 97.6 fl (ref 78.0–100.0)
Monocytes Absolute: 0.4 10*3/uL (ref 0.1–1.0)
Monocytes Relative: 3.8 % (ref 3.0–12.0)
Neutro Abs: 8.6 10*3/uL — ABNORMAL HIGH (ref 1.4–7.7)
Neutrophils Relative %: 89.7 % — ABNORMAL HIGH (ref 43.0–77.0)
Platelets: 264 10*3/uL (ref 150.0–400.0)
RBC: 4.35 Mil/uL (ref 3.87–5.11)
RDW: 13.8 % (ref 11.5–15.5)
WBC: 9.5 10*3/uL (ref 4.0–10.5)

## 2019-03-11 LAB — BASIC METABOLIC PANEL
BUN: 12 mg/dL (ref 6–23)
CO2: 29 mEq/L (ref 19–32)
Calcium: 8.8 mg/dL (ref 8.4–10.5)
Chloride: 86 mEq/L — ABNORMAL LOW (ref 96–112)
Creatinine, Ser: 0.58 mg/dL (ref 0.40–1.20)
GFR: 100.9 mL/min (ref >60.00)
Glucose, Bld: 135 mg/dL — ABNORMAL HIGH (ref 70–99)
Potassium: 3.7 mEq/L (ref 3.5–5.1)
Sodium: 124 mEq/L — ABNORMAL LOW (ref 135–145)

## 2019-03-11 NOTE — Assessment & Plan Note (Signed)
Quit smoking 2007 - 02 dep 2015  - alpha one AT screen 06/03/2018  MM level 160 - Spirometry 06/17/2018  FEV1 0.6 (32%)  Ratio 0.50  p trelegy in am/ classic curvature   - 10/07/2018  After extensive coaching inhaler device,  effectiveness =    90% with dpi / 75% hfa   - 03/11/2019  After extensive coaching inhaler device,  effectiveness =    50% hfa (ti too short)      Group D in terms of symptom/risk and laba/lama/ICS  therefore appropriate rx at this point >>>  Continue Trelegy as plan A but work on B/C - see avs for instructions unique to this ov

## 2019-03-11 NOTE — Progress Notes (Signed)
Rachel Evans, female    DOB: 09/16/1940,    MRN: [id number]   Brief patient profile:  82 yowf  MM quit smoking 2007 with doe to point where trouble steps but did not req 02 or meds but gradually worse and by  2015 rx= various inhalers and even 02 but didn't use consistently with doe = MMRC2   then acutely worse late Oct 2019 green sputum on trelegy rx levaquin > sore leg tendons so rx augmentin which helped mucus but breathing worse rx nebulizer xopenex then admitted 04/26/18 with "pna/sepsis"@ [place]  Floor bed > d/c May 03 2018 on IV ancef which was complete Jan 15th 2020 and referred to pulmonary clinic 06/03/2018 by Dr   Gilford Evans.with GOLD III criteria by spirometry 06/17/2018     History of Present Illness  06/03/2018  Pulmonary/ 1st office eval/Rachel Evans  Chief Complaint  Patient presents with  . Pulmonary Consult    Referred by Rachel Evans. Pt states had PNA 04/26/18. She c/o SOB "sometimes"- occ gets winded walking room to room. She has occ cough with clear sputum.   Dyspnea:  MMRC3 = can't walk 100 yards even at a slow pace at a flat grade s stopping due to sob   Cough: clear /no am flares Sleep: on side hob on 1 pillow flat bed  SABA use: not using proair/ has neb with levoalbuterol / back on trelgy now / a bit confused when when / how to use saba  02  None at rest/ 3lpm hs and 3 pulsed  out Typically sats are maint  low 90s at rest RA and upper 80s on POC rec Plan A = Automatic = Trelegy one click daily  Plan B = Backup Only use your albuterol(proair) inhaler as a rescue medication  Work on inhaler technique:  Plan C = Crisis - only use your levoalbuterol nebulizer if you first try Plan B    06/17/2018  f/u ov/Rachel Evans re: GOLD 3 / 02 dep hs and ex on trelegy maint  Chief Complaint  Patient presents with  . Follow-up    breathing has been worse over the past 2 days. Her voice has been hoarse but she is not coughing much. She rarely uses her albuterol inhaler or xopenex  neb.   Dyspnea:  Was able to cross parking lot into [employer] s 02 and shopped  Cough: dry raspy daytime aggravated by voice use (on ACEi)  Sleeping: on side flat bed/ one pillow  SABA use: none  02:  Sleeping on 2.5,  At rest 92%  RA and most she does is usually 4lpm rec Adjust your 02 to whatever you need to keep your saturation above 90%  Your lisinopril may cause tickle/ hoarseness/ cough that mimics copd / asthma    10/07/2018  f/u ov/Rachel Evans re: copd GOLD III on trelegy and ACEi  Chief Complaint  Patient presents with  . Follow-up    COPD. She reports not having to use her rescue inhaler. She does get DOE.   Dyspnea: doe hc parking/ [employer] slowly = MMRC2 = can't walk a nl pace on a flat grade s sob but does fine slow and flat  Cough: dry daytime assoc with hoarseness  Sleeping: 2 pillow no problme SABA use: much less now  02: 2lpm hs and prn though not   monitoring sats as rec  rec Consider changing zestoretic to any number of suitable alternatives that don't contain ACE inhibitors which tend  to mimic all your freq copd symptoms and make it difficult to sort out where exactly they are coming from     03/11/2019  f/u ov/Rachel Evans re: copd on trelegy daily off acei x 02/16/19  Chief Complaint  Patient presents with  . Follow-up    Breathing has improved since the last visit. She rarely uses her albuterol inhaler, but uses her xopenex neb 1-2 x per day.   Dyspnea:  Several  Hundred feet to garden sometime with and sometimes without 02 Cough: sporadic not worse in am / clear mucus - better than it was  Sleeping: fetal bed is flat no resp problems hs  SABA use: never uses pre ex/ changed neb to bid not using hfa prn  But  hfa is poor  02:  2.5 lpm hs /  02 sats at rest typically ok s 02     No obvious day to day or daytime variability or assoc excess/ purulent sputum or mucus plugs or hemoptysis or cp or chest tightness, subjective wheeze or overt sinus or hb symptoms.   Sleeping   without nocturnal  or early am exacerbation  of respiratory  c/o's or need for noct saba. Also denies any obvious fluctuation of symptoms with weather or environmental changes or other aggravating or alleviating factors except as outlined above   No unusual exposure hx or h/o childhood pna/ asthma or knowledge of premature birth.  Current Allergies, Complete Past Medical History, Past Surgical History, Family History, and Social History were reviewed in [employer] record.  ROS  The following are not active complaints unless bolded Hoarseness, sore throat, dysphagia, dental problems, itching, sneezing,  nasal congestion or discharge of excess mucus or purulent secretions, ear ache,   fever, chills, sweats, unintended wt loss or wt gain, classically pleuritic or exertional cp,  orthopnea pnd or arm/hand swelling  or leg swelling, presyncope, palpitations, abdominal pain, anorexia, nausea, vomiting, diarrhea  or change in bowel habits or change in bladder habits, change in stools or change in urine, dysuria, hematuria,  rash, arthralgias, visual complaints, headache, numbness, weakness or ataxia or problems with walking or coordination,  change in mood= anxious or  memory.        Current Meds  Medication Sig  . albuterol (PROVENTIL HFA;VENTOLIN HFA) 108 (90 Base) MCG/ACT inhaler Inhale 2 puffs into the lungs every 4 (four) hours as needed.  Marland Kitchen aspirin EC 81 MG tablet Take 81 mg by mouth daily.   Marland Kitchen b complex vitamins capsule Take 1 capsule by mouth daily.   . Biotin 10000 MCG TABS Take 1 capsule by mouth daily.  Marland Kitchen CALCIUM PO Take 1 tablet by mouth daily.  . Cholecalciferol (VITAMIN D-1000 MAX ST) 25 MCG (1000 UT) tablet Take 1,000 Units by mouth daily.   . fluticasone (FLONASE) 50 MCG/ACT nasal spray Place 1 spray into both nostrils daily as needed.   . Fluticasone-Umeclidin-Vilant 100-62.5-25 MCG/INH AEPB Inhale 1 puff into the lungs daily.  . furosemide (LASIX) 20 MG tablet  Take 10 mg by mouth daily as needed.  . levalbuterol (XOPENEX) 0.63 MG/3ML nebulizer solution 1 vial 4 x daily as needed  . Omega-3 1000 MG CAPS Take 1 capsule by mouth 2 (two) times daily.   . OXYGEN 3lpm with sleep and then as needed during the day  [facility]  . pravastatin (PRAVACHOL) 20 MG tablet Take 20 mg by mouth daily.  . sertraline (ZOLOFT) 50 MG tablet Take 50 mg by mouth daily.   Marland Kitchen  telmisartan-hydrochlorothiazide (MICARDIS HCT) 40-12.5 MG tablet Take 1 tablet by mouth daily.                   Objective:     amb wf nad    03/11/2019        99 10/07/2018          97   06/17/18 95 lb 12.8 oz (43.5 kg)  06/03/18 96 lb 9.6 oz (43.8 kg)  05/27/18 98 lb 9.6 oz (44.7 kg)     BP 120/80 (BP Location: Left Arm, Cuff Size: Normal)   Pulse 76   Temp (!) 97.2 F (36.2 C) (Temporal)   Ht 5\' 1"  (1.549 m)   Wt 99 lb 3.2 oz (45 kg)   SpO2 96% Comment: 2lpm pulsed o2  BMI 18.74 kg/m     HEENT : pt wearing mask not removed for exam due to covid -19 concerns.    NECK :  without JVD/Nodes/TM/ nl carotid upstrokes bilaterally   LUNGS: no acc muscle use,  Mod barrel  contour chest wall with bilateral  Distant bs s audible wheeze and  without cough on insp or exp maneuvers and mod  Hyperresonant  to  percussion bilaterally     CV:  RRR  no s3 or murmur or increase in P2, and no edema   ABD:  soft and nontender with pos mid insp Hoover's  in the supine position. No bruits or organomegaly appreciated, bowel sounds nl  MS:     ext warm without deformities, calf tenderness, cyanosis or clubbing No obvious joint restrictions   SKIN: warm and dry without lesions    NEURO:  alert, approp, nl sensorium with  no motor or cerebellar deficits apparent.       Labs ordered/ reviewed:      Chemistry      Component Value Date/Time   NA 124 (L) 03/11/2019 1442   K 3.7 03/11/2019 1442   CL 86 (L) 03/11/2019 1442   CO2 29 03/11/2019 1442   BUN 12 03/11/2019 1442    CREATININE 0.58 03/11/2019 1442      Component Value Date/Time   CALCIUM 8.8 03/11/2019 1442        Lab Results  Component Value Date   WBC 9.5 03/11/2019   HGB 14.4 03/11/2019   HCT 42.4 03/11/2019   MCV 97.6 03/11/2019   PLT 264.0 03/11/2019       EOS                                                              0.0    03/11/2019      Na  12/31/18 = 131      I personally reviewed images and agree with radiology impression as follows:  CXR:   02/25/2019     Assessment

## 2019-03-11 NOTE — Assessment & Plan Note (Addendum)
D/c acei 02/16/2019 due to cough > improved 03/11/2019    Adequate control on present rx, reviewed in detail with pt >  bp better but na dropping on hctz so change to micardis 80 and d/c hctz

## 2019-03-11 NOTE — Assessment & Plan Note (Addendum)
02 dep since 2015 though still not needing at rest as recently as 04/2018  - no longer needing at rest as of 06/17/2018   rx = 2.5 lpm hs and prn daytime as a separate issue from doe as the two are not the same in most copd pts   Advised on goals of 02 rx to keep sats > 90% at rest and with activity   I had an extended discussion with the patient reviewing all relevant studies completed to date and  lasting 15 to 20 minutes of a 25 minute visit    I performed detailed device teaching using a teach back method which extended face to face time for this visit (see above)  Each maintenance medication was reviewed in detail including emphasizing most importantly the difference between maintenance and prns and under what circumstances the prns are to be triggered using an action plan format that is not reflected in the computer generated alphabetically organized AVS which I have not found useful in most complex patients, especially with respiratory illnesses  Please see AVS for specific instructions unique to this visit that I personally wrote and verbalized to the the pt in detail and then reviewed with pt  by my nurse highlighting any  changes in therapy recommended at today's visit to their plan of care.

## 2019-03-11 NOTE — Patient Instructions (Addendum)
Make sure you check your oxygen saturations at highest level of activity to be sure it stays over 90% and adjust upward to maintain this level if needed but remember to turn it back to previous settings when you stop (to conserve your supply).   Plan A = Automatic = Always=    Trelegy one click each am   Plan B = Backup (to supplement plan A, not to replace it) Only use your albuterol inhaler as a rescue medication to be used if you can't catch your breath by resting or doing a relaxed purse lip breathing pattern.  - The less you use it, the better it will work when you need it. - Ok to use the inhaler up to 2 puffs  every 4 hours if you must but call for appointment if use goes up over your usual need - Don't leave home without it !!  (think of it like the spare tire for your car)   Plan C = Crisis (instead of Plan B but only if Plan B stops working) - only use your albuterol nebulizer if you first try Plan B and it fails to help > ok to use the nebulizer up to every 4 hours but if start needing it regularly call for immediate appointment   Please remember to go to the lab department   for your tests - we will call you with the results when they are available.      Please schedule a follow up visit in 6  months but call sooner if needed Add Na down to 124 so d/c hctz and change micardis to 80 mg (break in half if too strong) and use lasix prn swelling/restrict fluids

## 2019-03-12 ENCOUNTER — Encounter: Payer: Self-pay | Admitting: Internal Medicine

## 2019-03-12 DIAGNOSIS — E871 Hypo-osmolality and hyponatremia: Secondary | ICD-10-CM | POA: Insufficient documentation

## 2019-03-12 MED ORDER — TELMISARTAN 80 MG PO TABS: 80.0000 mg | ORAL_TABLET | Freq: Every day | ORAL | 11 refills | Status: DC

## 2019-03-12 NOTE — Assessment & Plan Note (Signed)
Na  12/31/18 = 131 on zestorectic  Na  03/11/2019  = 124 so rec d/c hctz altogether and restrict water   rec recheck in one week  Continue lasix prn swelling

## 2019-03-14 ENCOUNTER — Other Ambulatory Visit: Payer: Self-pay | Admitting: Internal Medicine

## 2019-03-14 ENCOUNTER — Telehealth: Payer: Self-pay | Admitting: Internal Medicine

## 2019-03-14 DIAGNOSIS — E871 Hypo-osmolality and hyponatremia: Secondary | ICD-10-CM

## 2019-03-14 NOTE — Telephone Encounter (Signed)
Spoke with Rachel Evans at [facility] and verified that pt should no longer be taking the telmisartan hct and should just be on telmisartan alone  She verbalized understanding  Nothing further needed

## 2019-03-14 NOTE — Progress Notes (Signed)
Spoke with pt and notified of results per Dr. Wert. Pt verbalized understanding and denied any questions. 

## 2019-03-17 DIAGNOSIS — J449 Chronic obstructive pulmonary disease, unspecified: Secondary | ICD-10-CM | POA: Diagnosis not present

## 2019-04-05 ENCOUNTER — Other Ambulatory Visit (INDEPENDENT_AMBULATORY_CARE_PROVIDER_SITE_OTHER): Payer: PPO

## 2019-04-05 DIAGNOSIS — E871 Hypo-osmolality and hyponatremia: Secondary | ICD-10-CM

## 2019-04-05 LAB — BASIC METABOLIC PANEL
BUN: 14 mg/dL (ref 6–23)
CO2: 27 mEq/L (ref 19–32)
Calcium: 9 mg/dL (ref 8.4–10.5)
Chloride: 99 mEq/L (ref 96–112)
Creatinine, Ser: 0.58 mg/dL (ref 0.40–1.20)
GFR: 100.88 mL/min (ref >60.00)
Glucose, Bld: 95 mg/dL (ref 70–99)
Potassium: 3.7 mEq/L (ref 3.5–5.1)
Sodium: 134 mEq/L — ABNORMAL LOW (ref 135–145)

## 2019-04-05 NOTE — Progress Notes (Signed)
Spoke with pt and notified of results per Dr. Wert. Pt verbalized understanding and denied any questions. 

## 2019-04-16 DIAGNOSIS — J449 Chronic obstructive pulmonary disease, unspecified: Secondary | ICD-10-CM | POA: Diagnosis not present

## 2019-05-17 DIAGNOSIS — J449 Chronic obstructive pulmonary disease, unspecified: Secondary | ICD-10-CM | POA: Diagnosis not present

## 2019-06-17 DIAGNOSIS — J449 Chronic obstructive pulmonary disease, unspecified: Secondary | ICD-10-CM | POA: Diagnosis not present

## 2019-06-28 DIAGNOSIS — L578 Other skin changes due to chronic exposure to nonionizing radiation: Secondary | ICD-10-CM | POA: Diagnosis not present

## 2019-06-28 DIAGNOSIS — L570 Actinic keratosis: Secondary | ICD-10-CM | POA: Diagnosis not present

## 2019-06-28 DIAGNOSIS — L821 Other seborrheic keratosis: Secondary | ICD-10-CM | POA: Diagnosis not present

## 2019-06-28 DIAGNOSIS — C44722 Squamous cell carcinoma of skin of right lower limb, including hip: Secondary | ICD-10-CM | POA: Diagnosis not present

## 2019-07-01 DIAGNOSIS — C44722 Squamous cell carcinoma of skin of right lower limb, including hip: Secondary | ICD-10-CM | POA: Diagnosis not present

## 2019-07-04 DIAGNOSIS — I10 Essential (primary) hypertension: Secondary | ICD-10-CM | POA: Diagnosis not present

## 2019-07-04 DIAGNOSIS — F339 Major depressive disorder, recurrent, unspecified: Secondary | ICD-10-CM | POA: Diagnosis not present

## 2019-07-04 DIAGNOSIS — E782 Mixed hyperlipidemia: Secondary | ICD-10-CM | POA: Diagnosis not present

## 2019-07-04 DIAGNOSIS — J9611 Chronic respiratory failure with hypoxia: Secondary | ICD-10-CM | POA: Diagnosis not present

## 2019-07-04 DIAGNOSIS — Z780 Asymptomatic menopausal state: Secondary | ICD-10-CM | POA: Diagnosis not present

## 2019-07-04 DIAGNOSIS — J449 Chronic obstructive pulmonary disease, unspecified: Secondary | ICD-10-CM | POA: Diagnosis not present

## 2019-07-04 DIAGNOSIS — M8580 Other specified disorders of bone density and structure, unspecified site: Secondary | ICD-10-CM | POA: Diagnosis not present

## 2019-07-15 DIAGNOSIS — J449 Chronic obstructive pulmonary disease, unspecified: Secondary | ICD-10-CM | POA: Diagnosis not present

## 2019-07-19 DIAGNOSIS — J449 Chronic obstructive pulmonary disease, unspecified: Secondary | ICD-10-CM | POA: Diagnosis not present

## 2019-07-19 DIAGNOSIS — F339 Major depressive disorder, recurrent, unspecified: Secondary | ICD-10-CM | POA: Diagnosis not present

## 2019-07-19 DIAGNOSIS — I10 Essential (primary) hypertension: Secondary | ICD-10-CM | POA: Diagnosis not present

## 2019-07-19 DIAGNOSIS — E782 Mixed hyperlipidemia: Secondary | ICD-10-CM | POA: Diagnosis not present

## 2019-08-03 DIAGNOSIS — Z780 Asymptomatic menopausal state: Secondary | ICD-10-CM | POA: Diagnosis not present

## 2019-08-03 DIAGNOSIS — M85851 Other specified disorders of bone density and structure, right thigh: Secondary | ICD-10-CM | POA: Diagnosis not present

## 2019-08-09 ENCOUNTER — Telehealth: Payer: Self-pay | Admitting: Internal Medicine

## 2019-08-09 DIAGNOSIS — Z1231 Encounter for screening mammogram for malignant neoplasm of breast: Secondary | ICD-10-CM | POA: Diagnosis not present

## 2019-08-09 NOTE — Telephone Encounter (Signed)
Spoke with the pt  She was going to get a mammogram at that time  I scheduled her for televisit with Aaron Edelman for tomorrow at 11 am

## 2019-08-09 NOTE — Telephone Encounter (Signed)
Spoke with patient. She stated that she has not been feeling well for the past 3-4 days. She has noticed increased SOB and a productive cough with yellow phlegm. She denied any fevers, chills or body aches. Increased fatigue. She still using her Trelegy 100 once daily and her albuterol HFA as needed. She has a history of PNA and was in the hospital. She stated that her symptoms feel the same as they did when she was diagnosed with PNA last year.   I attempted to offer her an virtual appointment today but there are no open slots. We only have slots for tomorrow but she does not want to wait until then. She wants to know if she could have an antibiotic sent in for her.  Pharmacy is [facility] in [place].   TP, please advise. Thanks!

## 2019-08-09 NOTE — Telephone Encounter (Signed)
Please put on for [facility] today at 1:30 for visit virtual

## 2019-08-10 ENCOUNTER — Encounter: Payer: Self-pay | Admitting: Pulmonary Disease

## 2019-08-10 ENCOUNTER — Other Ambulatory Visit: Payer: Self-pay

## 2019-08-10 ENCOUNTER — Ambulatory Visit (INDEPENDENT_AMBULATORY_CARE_PROVIDER_SITE_OTHER): Payer: PPO | Admitting: Pulmonary Disease

## 2019-08-10 DIAGNOSIS — J9611 Chronic respiratory failure with hypoxia: Secondary | ICD-10-CM

## 2019-08-10 DIAGNOSIS — J441 Chronic obstructive pulmonary disease with (acute) exacerbation: Secondary | ICD-10-CM | POA: Diagnosis not present

## 2019-08-10 DIAGNOSIS — J449 Chronic obstructive pulmonary disease, unspecified: Secondary | ICD-10-CM

## 2019-08-10 MED ORDER — PREDNISONE 10 MG PO TABS: ORAL_TABLET | ORAL | 0 refills | Status: DC

## 2019-08-10 MED ORDER — AZITHROMYCIN 250 MG PO TABS: ORAL_TABLET | ORAL | 0 refills | Status: DC

## 2019-08-10 NOTE — Assessment & Plan Note (Signed)
Plan: Continue oxygen therapy as prescribed 

## 2019-08-10 NOTE — Patient Instructions (Addendum)
You were seen today by Lauraine Rinne, NP  for:  1. COPD mixed type ([place])  - predniSONE (DELTASONE) 10 MG tablet; 4 tabs for 2 days, then 3 tabs for 2 days, 2 tabs for 2 days, then 1 tab for 2 days, then stop  Dispense: 20 tablet; Refill: 0 - azithromycin (ZITHROMAX) 250 MG tablet; 500mg  (two tablets) today, then 250mg  (1 tablet) for the next 4 days  Dispense: 6 tablet; Refill: 0  Trelegy Ellipta  >>> 1 puff daily in the morning >>>rinse mouth out after use  >>> This inhaler contains 3 medications that help manage her respiratory status, contact our office if you cannot afford this medication or unable to remain on this medication  Only use your albuterol as a rescue medication to be used if you can't catch your breath by resting or doing a relaxed purse lip breathing pattern.  - The less you use it, the better it will work when you need it. - Ok to use up to 2 puffs  every 4 hours if you must but call for immediate appointment if use goes up over your usual need - Don't leave home without it !!  (think of it like the spare tire for your car)   Note your daily symptoms > remember "red flags" for COPD:   >>>Increase in cough >>>increase in sputum production >>>increase in shortness of breath or activity  intolerance.   If you notice these symptoms, please call the office to be seen.   2. COPD exacerbation ([place]) 3. Chronic respiratory failure with hypoxia (HCC)  Continue oxygen therapy as prescribed  >>>maintain oxygen saturations greater than 88 percent  >>>if unable to maintain oxygen saturations please contact the office  >>>do not smoke with oxygen  >>>can use nasal saline gel or nasal saline rinses to moisturize nose if oxygen causes dryness    We recommend today:   Meds ordered this encounter  Medications  . predniSONE (DELTASONE) 10 MG tablet    Sig: 4 tabs for 2 days, then 3 tabs for 2 days, 2 tabs for 2 days, then 1 tab for 2 days, then stop    Dispense:  20 tablet   Refill:  0  . azithromycin (ZITHROMAX) 250 MG tablet    Sig: 500mg  (two tablets) today, then 250mg  (1 tablet) for the next 4 days    Dispense:  6 tablet    Refill:  0    Follow Up:    Return in about 4 weeks (around 09/07/2019), or if symptoms worsen or fail to improve, for Follow up with Dr. Melvyn Novas.   Please do your part to reduce the spread of COVID-19:      Reduce your risk of any infection  and COVID19 by using the similar precautions used for avoiding the common cold or flu:  Marland Kitchen Wash your hands often with soap and warm water for at least 20 seconds.  If soap and water are not readily available, use an alcohol-based hand sanitizer with at least 60% alcohol.  . If coughing or sneezing, cover your mouth and nose by coughing or sneezing into the elbow areas of your shirt or coat, into a tissue or into your sleeve (not your hands). Langley Gauss A MASK when in public  . Avoid shaking hands with others and consider head nods or verbal greetings only. . Avoid touching your eyes, nose, or mouth with unwashed hands.  . Avoid close contact with people who are sick. . Avoid places  or events with large numbers of people in one location, like concerts or sporting events. . If you have some symptoms but not all symptoms, continue to monitor at home and seek medical attention if your symptoms worsen. . If you are having a medical emergency, call 911.   [place] / e-Visit: [email or web address]         [facility]: [facility]: [id number]                   [facility]: [id number]     It is flu season:   >>> Best ways to protect herself from the flu: Receive the yearly flu vaccine, practice good hand hygiene washing with soap and also using hand sanitizer when available, eat a nutritious meals, get adequate rest, hydrate  appropriately   Please contact the office if your symptoms worsen or you have concerns that you are not improving.   Thank you for choosing [facility] for your healthcare, and for allowing [place] to partner with you on your healthcare journey. I am thankful to be able to provide care to you today.   Wyn Quaker FNP-C

## 2019-08-10 NOTE — Assessment & Plan Note (Signed)
Known severe COPD based off of 2020 spirometry No formal PFTs  Plan: Z-Pak today Prednisone taper today 4-week follow-up with Dr. Melvyn Novas Notify patient to contact her office sooner if symptoms or not improving under these recommendations

## 2019-08-10 NOTE — Progress Notes (Signed)
Virtual Visit via Telephone Note  I connected with Rachel Evans on 08/10/19 at 11:00 AM EDT by telephone and verified that I am speaking with the correct person using two identifiers.  Location: Patient: Home Provider: [facility] - [id number], [place], [place], [place] [address]   I discussed the limitations, risks, security and privacy concerns of performing an evaluation and management service by telephone and the availability of in person appointments. I also discussed with the patient that there may be a patient responsible charge related to this service. The patient expressed understanding and agreed to proceed.  Patient consented to consult via telephone: Yes People present and their role in pt care: Pt     History of Present Illness:  79 year old female former smoker followed in our office for COPD, current and chronic respiratory failure  Past medical history: Sepsis, hypertension, hyperlipidemia, mitral valve disorder, hyponatremia Smoking history: Former smoker.  Quit 2007. Maintenance: Trelegy Ellipta 100 Patient of Dr. Melvyn Novas  Chief complaint: Increased shortness of breath   79 year old female former smoker followed in our office for COPD.  Patient reporting that over the last 2 weeks has had increased sputum production.  Sputum color is also changed to yellow.  She is not had any recent antibiotics over the last month.  She reports she did have a 5-day course of an antibiotic from a dermatologist due to evaluation of a skin cancer.  She cannot remember which one it is.  She denies any wheezing and she is not currently using a rescue inhaler.  She does not feel the rescue inhaler helps her.  She reports adherence to Trelegy Ellipta.  Her largest persistent symptom today is increase sputum production which is thick and has changed color.  She denies any fevers.  Patient denies any recent weight increases or lower extremity  swelling.   Observations/Objective:  03/11/2019-CBC with differential-eosinophils relative 0.1, eosinophils absolute 0  02/25/2019-chest x-ray-no acute cardiopulmonary disease, COPD with advanced emphysema  06/17/2018-spirometry-FVC 1.2 (48% predicted), ratio 50, FEV1 0.6 (32% predicted)  Social History   Tobacco Use  Smoking Status Former Smoker  . Packs/day: 1.50  . Years: 40.00  . Pack years: 60.00  . Types: Cigarettes  . Quit date: 05/18/2005  . Years since quitting: 14.2  Smokeless Tobacco Never Used   Immunization History  Administered Date(s) Administered  . Influenza, Quadrivalent, Recombinant, Inj, Pf 12/31/2018  . Influenza,inj,Quad PF,6+ Mos 01/16/2015  . Influenza-Unspecified 02/06/2017, 02/04/2018  . Pneumococcal Conjugate-13 03/05/2013  . Pneumococcal Polysaccharide-23 05/06/2007, 11/27/2016  . Tetanus 05/06/2007  . Zoster 05/06/2007  . Zoster Recombinat (Shingrix) 11/06/2016      Assessment and Plan:  COPD exacerbation ([place]) Known severe COPD based off of 2020 spirometry No formal PFTs  Plan: Z-Pak today Prednisone taper today 4-week follow-up with Dr. Melvyn Novas Notify patient to contact her office sooner if symptoms or not improving under these recommendations  COPD  GOLD III Plan: Z-Pak today Prednisone taper today Continue Trelegy Ellipta 4-week follow-up with our office with Dr. Cheri Kearns to cancel 08/16/2019 already scheduled follow-up with EW NP  Chronic respiratory failure with hypoxia ([place]) Plan: Continue oxygen therapy as prescribed   Follow Up Instructions:  Return in about 4 weeks (around 09/07/2019), or if symptoms worsen or fail to improve, for Follow up with Dr. Melvyn Novas.   I discussed the assessment and treatment plan with the patient. The patient was provided an opportunity to ask questions and all were answered. The patient agreed  with the plan and demonstrated an understanding of the instructions.   The patient was advised to call  back or seek an in-person evaluation if the symptoms worsen or if the condition fails to improve as anticipated.  I provided 15 minutes of non-face-to-face time during this encounter.   Lauraine Rinne, NP

## 2019-08-10 NOTE — Assessment & Plan Note (Signed)
Plan: Z-Pak today Prednisone taper today Continue Trelegy Ellipta 4-week follow-up with our office with Dr. Cheri Kearns to cancel 08/16/2019 already scheduled follow-up with EW NP

## 2019-08-15 DIAGNOSIS — J449 Chronic obstructive pulmonary disease, unspecified: Secondary | ICD-10-CM | POA: Diagnosis not present

## 2019-08-16 ENCOUNTER — Ambulatory Visit: Payer: PPO | Admitting: Primary Care

## 2019-09-08 ENCOUNTER — Ambulatory Visit: Payer: PPO | Admitting: Internal Medicine

## 2019-09-12 ENCOUNTER — Encounter: Payer: Self-pay | Admitting: Internal Medicine

## 2019-09-12 ENCOUNTER — Ambulatory Visit: Payer: PPO | Admitting: Internal Medicine

## 2019-09-12 ENCOUNTER — Telehealth: Payer: Self-pay | Admitting: Internal Medicine

## 2019-09-12 ENCOUNTER — Other Ambulatory Visit: Payer: Self-pay

## 2019-09-12 DIAGNOSIS — J9611 Chronic respiratory failure with hypoxia: Secondary | ICD-10-CM | POA: Diagnosis not present

## 2019-09-12 DIAGNOSIS — J449 Chronic obstructive pulmonary disease, unspecified: Secondary | ICD-10-CM | POA: Diagnosis not present

## 2019-09-12 MED ORDER — PREDNISONE 10 MG PO TABS: ORAL_TABLET | ORAL | 0 refills | Status: DC

## 2019-09-12 MED ORDER — ALBUTEROL SULFATE HFA 108 (90 BASE) MCG/ACT IN AERS: INHALATION_SPRAY | RESPIRATORY_TRACT | 11 refills | Status: DC

## 2019-09-12 NOTE — Telephone Encounter (Signed)
Called and spoke with patient and pharmacy. Inhaler has been switched from 18g to 6.7g. Patient has been notified. Nothing further needed at this time.

## 2019-09-12 NOTE — Progress Notes (Signed)
Rachel Evans, female    DOB: March 26, 1941,    MRN: [id number]   Brief patient profile:  82  yowf  MM quit smoking 2007 with doe to point where trouble steps but did not req 02 or meds but gradually worse and by  2015 rx= various inhalers and even 02 but didn't use consistently with doe = MMRC2   then acutely worse late Oct 2019 green sputum on trelegy rx levaquin > sore leg tendons so rx augmentin which helped mucus but breathing worse rx nebulizer xopenex then admitted 04/26/18 with "pna/sepsis"@ [facility]  Floor bed > d/c May 03 2018 on IV ancef which was complete Jan 15th 2020 and referred to pulmonary clinic 06/03/2018 by Dr   Gilford Rile.with GOLD III criteria by spirometry 06/17/2018     History of Present Illness  06/03/2018  Pulmonary/ 1st office eval/Rachel Evans  Chief Complaint  Patient presents with  . Pulmonary Consult    Referred by Dr. Gilford Rile. Pt states had PNA 04/26/18. She c/o SOB "sometimes"- occ gets winded walking room to room. She has occ cough with clear sputum.   Dyspnea:  MMRC3 = can't walk 100 yards even at a slow pace at a flat grade s stopping due to sob   Cough: clear /no am flares Sleep: on side hob on 1 pillow flat bed  SABA use: not using proair/ has neb with levoalbuterol / back on trelgy now / a bit confused when when / how to use saba  02  None at rest/ 3lpm hs and 3 pulsed  out Typically sats are maint  low 90s at rest RA and upper 80s on POC rec Plan A = Automatic = Trelegy one click daily  Plan B = Backup Only use your albuterol(proair) inhaler as a rescue medication  Work on inhaler technique:  Plan C = Crisis - only use your levoalbuterol nebulizer if you first try Plan B    06/17/2018  f/u ov/Rachel Evans re: GOLD 3 / 02 dep hs and ex on trelegy maint  Chief Complaint  Patient presents with  . Follow-up    breathing has been worse over the past 2 days. Her voice has been hoarse but she is not coughing much. She rarely uses her albuterol inhaler or xopenex  neb.   Dyspnea:  Was able to cross parking lot into [employer] s 02 and shopped  Cough: dry raspy daytime aggravated by voice use (on ACEi)  Sleeping: on side flat bed/ one pillow  SABA use: none  02:  Sleeping on 2.5,  At rest 92%  RA and most she does is usually 4lpm rec Adjust your 02 to whatever you need to keep your saturation above 90%  Your lisinopril may cause tickle/ hoarseness/ cough that mimics copd / asthma    10/07/2018  f/u ov/Rachel Evans re: copd GOLD III on trelegy and ACEi  Chief Complaint  Patient presents with  . Follow-up    COPD. She reports not having to use her rescue inhaler. She does get DOE.   Dyspnea: doe hc parking/ [employer] slowly = MMRC2 = can't walk a nl pace on a flat grade s sob but does fine slow and flat  Cough: dry daytime assoc with hoarseness  Sleeping: 2 pillow no problme SABA use: much less now  02: 2lpm hs and prn though not   monitoring sats as rec  rec Consider changing zestoretic to any number of suitable alternatives that don't contain ACE inhibitors which  tend to mimic all your freq copd symptoms and make it difficult to sort out where exactly they are coming from     03/11/2019  f/u ov/Rachel Evans re: copd on trelegy daily off acei x 02/16/19  Chief Complaint  Patient presents with  . Follow-up    Breathing has improved since the last visit. She rarely uses her albuterol inhaler, but uses her xopenex neb 1-2 x per day.   Dyspnea:  Several  Hundred feet to garden sometime with and sometimes without 02 Cough: sporadic not worse in am / clear mucus - better than it was  Sleeping: fetal bed is flat no resp problems hs  SABA use: never uses pre ex/ changed neb to bid not using hfa prn  But  hfa is poor  02:  2.5 lpm hs /  02 sats at rest typically ok s 02   rec Make sure you check your oxygen saturations at highest level of activity to be sure it stays over 90% and adjust upward to maintain this level  Plan A = Automatic = Always=    Trelegy one click  each am  Plan B = Backup (to supplement plan A, not to replace it) Only use your albuterol inhaler as a rescue medication Plan C = Crisis (instead of Plan B but only if Plan B stops working) - only use your albuterol nebulizer if you first try Plan B and it fails to help > ok to use the nebulizer up to every 4 hours but if start needing it regularly call for immediate appointment Please remember to go to the lab department   for your tests - we will call you with the results when they are available.  Please schedule a follow up visit in 6  months but call sooner if needed Add Na down to 124 so d/c hctz and change micardis to 80 mg (break in half if too strong) and use lasix prn swelling/restrict fluids     09/12/2019  f/u ov/Rachel Evans re:   COPD III / 02 dep lost ground since rehab/ covid shots jan / feb 2021 maint on trelegy / better p pred rx 08/10/19  Dyspnea:  Walking around the house makes her sob   Cough: none Sleeping: flat bed /one pillow, no resp symptoms  SABA use: never prechallenge  02: 3lpm at hs and usually not higher than 4 walking   No obvious day to day or daytime variability or assoc excess/ purulent sputum or mucus plugs or hemoptysis or cp or chest tightness, subjective wheeze or overt sinus or hb symptoms.   Sleeping  without nocturnal  or early am exacerbation  of respiratory  c/o's or need for noct saba. Also denies any obvious fluctuation of symptoms with weather or environmental changes or other aggravating or alleviating factors except as outlined above   No unusual exposure hx or h/o childhood pna/ asthma or knowledge of premature birth.  Current Allergies, Complete Past Medical History, Past Surgical History, Family History, and Social History were reviewed in [employer] record.  ROS  The following are not active complaints unless bolded Hoarseness, sore throat, dysphagia, dental problems, itching, sneezing,  nasal congestion or discharge of  excess mucus or purulent secretions, ear ache,   fever, chills, sweats, unintended wt loss or wt gain, classically pleuritic or exertional cp,  orthopnea pnd or arm/hand swelling  or leg swelling, presyncope, palpitations, abdominal pain, anorexia, nausea, vomiting, diarrhea  or change in bowel habits or  change in bladder habits, change in stools or change in urine, dysuria, hematuria,  rash, arthralgias, visual complaints, headache, numbness, weakness or ataxia or problems with walking or coordination,  change in mood or  memory.        Current Meds  Medication Sig  . albuterol (PROVENTIL HFA;VENTOLIN HFA) 108 (90 Base) MCG/ACT inhaler Inhale 2 puffs into the lungs every 4 (four) hours as needed.  Marland Kitchen aspirin EC 81 MG tablet Take 81 mg by mouth daily.   Marland Kitchen b complex vitamins capsule Take 1 capsule by mouth daily.   . Biotin 10000 MCG TABS Take 1 capsule by mouth daily.  Marland Kitchen CALCIUM PO Take 1 tablet by mouth daily.  . Cholecalciferol (VITAMIN D-1000 MAX ST) 25 MCG (1000 UT) tablet Take 1,000 Units by mouth daily.   . fluticasone (FLONASE) 50 MCG/ACT nasal spray Place 1 spray into both nostrils daily as needed.   . Fluticasone-Umeclidin-Vilant 100-62.5-25 MCG/INH AEPB Inhale 1 puff into the lungs daily.  . furosemide (LASIX) 20 MG tablet Take 10 mg by mouth daily as needed.  . levalbuterol (XOPENEX) 0.63 MG/3ML nebulizer solution 1 vial 4 x daily as needed  . Omega-3 1000 MG CAPS Take 1 capsule by mouth 2 (two) times daily.   . OXYGEN 3lpm with sleep and then as needed during the day  [employer]  . pravastatin (PRAVACHOL) 20 MG tablet Take 20 mg by mouth daily.  . sertraline (ZOLOFT) 50 MG tablet Take 50 mg by mouth daily.   Marland Kitchen telmisartan (MICARDIS) 80 MG tablet Take 1 tablet (80 mg total) by mouth daily.   Current Facility-Administered Medications for the 09/12/19 encounter (Office Visit) with Tanda Rockers, MD  Medication  . methylPREDNISolone acetate (DEPO-MEDROL) injection 60 mg                    Objective:     Pleasant thin amb wf nad  09/12/2019        98 03/11/2019        99 10/07/2018          97   06/17/18 95 lb 12.8 oz (43.5 kg)  06/03/18 96 lb 9.6 oz (43.8 kg)  05/27/18 98 lb 9.6 oz (44.7 kg)    Vital signs reviewed  09/12/2019  - Note at rest 02 sats  98% on POC      HEENT : pt wearing mask not removed for exam due to covid -19 concerns.    NECK :  without JVD/Nodes/TM/ nl carotid upstrokes bilaterally   LUNGS: no acc muscle use,  Mod barrel  contour chest wall with bilateral  Distant bs s audible wheeze and  without cough on insp or exp maneuvers and mod  Hyperresonant  to  percussion bilaterally     CV:  RRR  no s3 or murmur or increase in P2, and no edema   ABD:  soft and nontender with pos mid insp Hoover's  in the supine position. No bruits or organomegaly appreciated, bowel sounds nl  MS:     ext warm without deformities, calf tenderness, cyanosis or clubbing No obvious joint restrictions   SKIN: warm and dry without lesions    NEURO:  alert, approp, nl sensorium with  no motor or cerebellar deficits apparent.          I personally reviewed images and agree with radiology impression as follows:  CXR:   PA and LAT  02/25/2019 1. No acute cardiopulmonary disease. 2. COPD with advanced  emphysema.        Assessment

## 2019-09-12 NOTE — Patient Instructions (Addendum)
Try albuterol (either the puffer or neb)  15 min before an activity that you know would make you short of breath and see if it makes any difference and if makes none then don't take it after activity unless you can't catch your breath.   Make sure you check your oxygen saturations at highest level of activity to be sure it stays over 90% and adjust upward to maintain this level if needed but remember to turn it back to previous settings when you stop (to conserve your supply).    Prednisone 10 mg take  4 each am x 2 days,   2 each am x 2 days,  1 each am x 2 days and stop    Please schedule a follow up visit in 3 months but call sooner if needed

## 2019-09-13 ENCOUNTER — Encounter: Payer: Self-pay | Admitting: Internal Medicine

## 2019-09-13 NOTE — Assessment & Plan Note (Addendum)
Quit smoking 2007 - 02 dep 2015  - alpha one AT screen 06/03/2018  MM level 160 - Spirometry 06/17/2018  FEV1 0.6 (32%)  Ratio 0.50  p trelegy in am/ classic curvature   - 10/07/2018  After extensive coaching inhaler device,  effectiveness =    90% with dpi / 75% hfa   - 03/11/2019  After extensive coaching inhaler device,  effectiveness =    50% hfa (ti too short)     Group D in terms of symptom/risk and laba/lama/ICS  therefore appropriate rx at this point >>>  Continue trelegy / Prednisone 10 mg take  4 each am x 2 days,   2 each am x 2 days,  1 each am x 2 days and stop   - The proper method of use, as well as anticipated side effects, of a metered-dose inhaler are discussed and demonstrated to the patient. Improved effectiveness after extensive coaching during this visit to a level of approximately 75 % from a baseline of 50 % due to short Ti     Advised: I spent extra time with pt today reviewing appropriate use of albuterol for prn use on exertion with the following points: 1) saba is for relief of sob that does not improve by walking a slower pace or resting but rather if the pt does not improve after trying this first. 2) If the pt is convinced, as many are, that saba helps recover from activity faster then it's easy to tell if this is the case by re-challenging : ie stop, take the inhaler, then p 5 minutes try the exact same activity (intensity of workload) that just caused the symptoms and see if they are substantially diminished or not after saba 3) if there is an activity that reproducibly causes the symptoms, try the saba 15 min before the activity on alternate days   If in fact the saba really does help, then fine to continue to use it prn but advised may need to look closer at the maintenance regimen being used to achieve better control of airways disease with exertion.

## 2019-09-13 NOTE — Assessment & Plan Note (Signed)
02 dep since 2015 though still not needing at rest as recently as 04/2018  - no longer needing at rest as of 06/17/2018   Advised: Make sure you check your oxygen saturations at highest level of activity to be sure it stays over 90% and adjust upward to maintain this level if needed but remember to turn it back to previous settings when you stop (to conserve your supply).           Each maintenance medication was reviewed in detail including emphasizing most importantly the difference between maintenance and prns and under what circumstances the prns are to be triggered using an action plan format where appropriate.  Total time for H and P, chart review, counseling, teaching device and generating customized AVS unique to this office visit / charting = 20 min

## 2019-09-14 DIAGNOSIS — J449 Chronic obstructive pulmonary disease, unspecified: Secondary | ICD-10-CM | POA: Diagnosis not present

## 2019-09-22 DIAGNOSIS — F339 Major depressive disorder, recurrent, unspecified: Secondary | ICD-10-CM | POA: Diagnosis not present

## 2019-09-22 DIAGNOSIS — M8580 Other specified disorders of bone density and structure, unspecified site: Secondary | ICD-10-CM | POA: Diagnosis not present

## 2019-09-22 DIAGNOSIS — J449 Chronic obstructive pulmonary disease, unspecified: Secondary | ICD-10-CM | POA: Diagnosis not present

## 2019-09-22 DIAGNOSIS — I10 Essential (primary) hypertension: Secondary | ICD-10-CM | POA: Diagnosis not present

## 2019-09-22 DIAGNOSIS — I951 Orthostatic hypotension: Secondary | ICD-10-CM | POA: Diagnosis not present

## 2019-10-15 DIAGNOSIS — J449 Chronic obstructive pulmonary disease, unspecified: Secondary | ICD-10-CM | POA: Diagnosis not present

## 2019-11-14 DIAGNOSIS — J449 Chronic obstructive pulmonary disease, unspecified: Secondary | ICD-10-CM | POA: Diagnosis not present

## 2019-11-25 DIAGNOSIS — L570 Actinic keratosis: Secondary | ICD-10-CM | POA: Diagnosis not present

## 2019-11-25 DIAGNOSIS — L820 Inflamed seborrheic keratosis: Secondary | ICD-10-CM | POA: Diagnosis not present

## 2019-11-25 DIAGNOSIS — L821 Other seborrheic keratosis: Secondary | ICD-10-CM | POA: Diagnosis not present

## 2019-11-25 DIAGNOSIS — L578 Other skin changes due to chronic exposure to nonionizing radiation: Secondary | ICD-10-CM | POA: Diagnosis not present

## 2019-11-25 DIAGNOSIS — C44722 Squamous cell carcinoma of skin of right lower limb, including hip: Secondary | ICD-10-CM | POA: Diagnosis not present

## 2019-12-13 ENCOUNTER — Ambulatory Visit: Payer: PPO | Admitting: Internal Medicine

## 2019-12-15 DIAGNOSIS — J449 Chronic obstructive pulmonary disease, unspecified: Secondary | ICD-10-CM | POA: Diagnosis not present

## 2020-01-02 ENCOUNTER — Ambulatory Visit: Payer: PPO | Admitting: Internal Medicine

## 2020-01-02 ENCOUNTER — Telehealth: Payer: Self-pay | Admitting: Internal Medicine

## 2020-01-02 MED ORDER — PREDNISONE 10 MG PO TABS: ORAL_TABLET | ORAL | 0 refills | Status: DC

## 2020-01-02 NOTE — Telephone Encounter (Signed)
Ok to refill 

## 2020-01-02 NOTE — Telephone Encounter (Signed)
Spoke with patient regarding refill on Prednisone 10mg  sent into her [facility]  . Patient stated she had a office visit today and was unable to walk. Next office visit is 9/16. Offered patient a office visit with a app patient declined and wanted to keep her office visit with Dr.Wert.

## 2020-01-02 NOTE — Telephone Encounter (Signed)
Spoke with patient regarding refill on Prednisone 10mg  sent into her [facility]. Patient's voice was understanding. Nothing else further needed.

## 2020-01-15 DIAGNOSIS — J449 Chronic obstructive pulmonary disease, unspecified: Secondary | ICD-10-CM | POA: Diagnosis not present

## 2020-01-19 ENCOUNTER — Ambulatory Visit: Payer: PPO | Admitting: Internal Medicine

## 2020-01-19 ENCOUNTER — Encounter: Payer: Self-pay | Admitting: Internal Medicine

## 2020-01-19 ENCOUNTER — Ambulatory Visit (INDEPENDENT_AMBULATORY_CARE_PROVIDER_SITE_OTHER): Payer: PPO

## 2020-01-19 ENCOUNTER — Other Ambulatory Visit: Payer: Self-pay

## 2020-01-19 DIAGNOSIS — J439 Emphysema, unspecified: Secondary | ICD-10-CM | POA: Diagnosis not present

## 2020-01-19 DIAGNOSIS — J9611 Chronic respiratory failure with hypoxia: Secondary | ICD-10-CM

## 2020-01-19 DIAGNOSIS — J449 Chronic obstructive pulmonary disease, unspecified: Secondary | ICD-10-CM

## 2020-01-19 MED ORDER — BREZTRI AEROSPHERE 160-9-4.8 MCG/ACT IN AERO: 2.0000 | INHALATION_SPRAY | Freq: Two times a day (BID) | RESPIRATORY_TRACT | 0 refills | Status: DC

## 2020-01-19 MED ORDER — BREZTRI AEROSPHERE 160-9-4.8 MCG/ACT IN AERO: 2.0000 | INHALATION_SPRAY | Freq: Two times a day (BID) | RESPIRATORY_TRACT | Status: DC

## 2020-01-19 MED ORDER — PREDNISONE 10 MG PO TABS: ORAL_TABLET | ORAL | 0 refills | Status: DC

## 2020-01-19 NOTE — Patient Instructions (Addendum)
For cough > mucinex 1200 mg every 12 hours as needed and use the flutter valve   Plan A = Automatic = Always=    Breztri Take 2 puffs first thing in am and then another 2 puffs about 12 hours later.   Work on inhaler technique:  relax and gently blow all the way out then take a nice smooth deep breath back in, triggering the inhaler at same time you start breathing in.  Hold for up to 5 seconds if you can. Blow out thru nose. Rinse and gargle with water when done  Prednisone 10 mg Take 4 for three days 3 for three days 2 for three days 1 for three days and stop   Plan B = Backup (to supplement plan A, not to replace it) Only use your albuterol inhaler as a rescue medication to be used if you can't catch your breath by resting or doing a relaxed purse lip breathing pattern.  - The less you use it, the better it will work when you need it. - Ok to use the inhaler up to 2 puffs  every 4 hours if you must but call for appointment if use goes up over your usual need - Don't leave home without it !!  (think of it like the spare tire for your car)   Plan C = Crisis (instead of Plan B but only if Plan B stops working) - only use your albuterol nebulizer if you first try Plan B and it fails to help > ok to use the nebulizer up to every 4 hours but if start needing it regularly call for immediate appointment    Please schedule a follow up office visit in 4 weeks, sooner if needed

## 2020-01-19 NOTE — Progress Notes (Signed)
Rachel Evans, female    DOB: Sep 18, 1940,    MRN: [id number]   Brief patient profile:  22  yowf  MM quit smoking 2007 with doe to point where trouble steps but did not req 02 or meds but gradually worse and by  2015 rx= various inhalers and even 02 but didn't use consistently with doe = MMRC2   then acutely worse late Oct 2019 green sputum on trelegy rx levaquin > sore leg tendons so rx augmentin which helped mucus but breathing worse rx nebulizer xopenex then admitted 04/26/18 with "pna/sepsis"@ [facility]  Floor bed > d/c May 03 2018 on IV ancef which was complete Jan 15th 2020 and referred to pulmonary clinic 06/03/2018 by Dr   Rachel Evans.with GOLD III criteria by spirometry 06/17/2018     History of Present Illness  06/03/2018  Pulmonary/ 1st office eval/Rachel Evans  Chief Complaint  Patient presents with   Pulmonary Consult    Referred by Dr. Gilford Evans. Pt states had PNA 04/26/18. She c/o SOB "sometimes"- occ gets winded walking room to room. She has occ cough with clear sputum.   Dyspnea:  MMRC3 = can't walk 100 yards even at a slow pace at a flat grade s stopping due to sob   Cough: clear /no am flares Sleep: on side hob on 1 pillow flat bed  SABA use: not using proair/ has neb with levoalbuterol / back on trelgy now / a bit confused when when / how to use saba  02  None at rest/ 3lpm hs and 3 pulsed  out Typically sats are maint  low 90s at rest RA and upper 80s on POC rec Plan A = Automatic = Trelegy one click daily  Plan B = Backup Only use your albuterol(proair) inhaler as a rescue medication  Work on inhaler technique:  Plan C = Crisis - only use your levoalbuterol nebulizer if you first try Plan B    06/17/2018  f/u ov/Rachel Evans re: GOLD 3 / 02 dep hs and ex on trelegy maint  Chief Complaint  Patient presents with   Follow-up    breathing has been worse over the past 2 days. Her voice has been hoarse but she is not coughing much. She rarely uses her albuterol inhaler or xopenex  neb.   Dyspnea:  Was able to cross parking lot into [employer] s 02 and shopped  Cough: dry raspy daytime aggravated by voice use (on ACEi)  Sleeping: on side flat bed/ one pillow  SABA use: none  02:  Sleeping on 2.5,  At rest 92%  RA and most she does is usually 4lpm rec Adjust your 02 to whatever you need to keep your saturation above 90%  Your lisinopril may cause tickle/ hoarseness/ cough that mimics copd / asthma    10/07/2018  f/u ov/Rachel Evans re: copd GOLD III on trelegy and ACEi  Chief Complaint  Patient presents with   Follow-up    COPD. She reports not having to use her rescue inhaler. She does get DOE.   Dyspnea: doe hc parking/ [employer] slowly = MMRC2 = can't walk a nl pace on a flat grade s sob but does fine slow and flat  Cough: dry daytime assoc with hoarseness  Sleeping: 2 pillow no problme SABA use: much less now  02: 2lpm hs and prn though not   monitoring sats as rec  rec Consider changing zestoretic to any number of suitable alternatives that don't contain ACE inhibitors which  tend to mimic all your freq copd symptoms and make it difficult to sort out where exactly they are coming from     03/11/2019  f/u ov/Rachel Evans re: copd on trelegy daily off acei x 02/16/19  Chief Complaint  Patient presents with   Follow-up    Breathing has improved since the last visit. She rarely uses her albuterol inhaler, but uses her xopenex neb 1-2 x per day.   Dyspnea:  Several  Hundred feet to garden sometime with and sometimes without 02 Cough: sporadic not worse in am / clear mucus - better than it was  Sleeping: fetal bed is flat no resp problems hs  SABA use: never uses pre ex/ changed neb to bid not using hfa prn  But  hfa is poor  02:  2.5 lpm hs /  02 sats at rest typically ok s 02   rec Make sure you check your oxygen saturations at highest level of activity to be sure it stays over 90% and adjust upward to maintain this level  Plan A = Automatic = Always=    Trelegy one click  each am  Plan B = Backup (to supplement plan A, not to replace it) Only use your albuterol inhaler as a rescue medication Plan C = Crisis (instead of Plan B but only if Plan B stops working) - only use your albuterol nebulizer if you first try Plan B and it fails to help > ok to use the nebulizer up to every 4 hours but if start needing it regularly call for immediate appointment Please remember to go to the lab department   for your tests - we will call you with the results when they are available.  Please schedule a follow up visit in 6  months but call sooner if needed Add Na down to 124 so d/c hctz and change micardis to 80 mg (break in half if too strong) and use lasix prn swelling/restrict fluids     09/12/2019  f/u ov/Rachel Evans re:   COPD III / 02 dep lost ground since rehab/ covid shots jan / feb 2021 maint on trelegy / better p pred rx 08/10/19  Dyspnea:  Walking around the house makes her sob   Cough: none Sleeping: flat bed /one pillow, no resp symptoms  SABA use: never prechallenge  02: 3lpm at hs and usually not higher than 4 walking rec Try albuterol (either the puffer or neb)  15 min before   Make sure you check your oxygen saturations at highest level of activity  Prednisone 10 mg take  4 each am x 2 days,   2 each am x 2 days,  1 each am x 2 days and stop      01/19/2020  f/u ov/Rachel Evans re:  Copd III / 02 dep / trelegy maint  Chief Complaint  Patient presents with   Follow-up    COPD, SOB is worse  Dyspnea:  Sob 50 ft  Cough: no  Sleeping: bed flat, big pillow  SABA use: never prechallenges  02: 2-3 lpm and 4-5 with activity    No obvious day to day or daytime variability or assoc excess/ purulent sputum or mucus plugs or hemoptysis or cp or chest tightness, subjective wheeze or overt sinus or hb symptoms.   Sleeping fine without nocturnal  or early am exacerbation  of respiratory  c/o's or need for noct saba. Also denies any obvious fluctuation of symptoms with weather  or environmental changes or other  aggravating or alleviating factors except as outlined above   No unusual exposure hx or h/o childhood pna/ asthma or knowledge of premature birth.  Current Allergies, Complete Past Medical History, Past Surgical History, Family History, and Social History were reviewed in [employer] [employer] record.  ROS  The following are not active complaints unless bolded Hoarseness, sore throat, dysphagia, dental problems, itching, sneezing,  nasal congestion or discharge of excess mucus or purulent secretions, ear ache,   fever, chills, sweats, unintended wt loss or wt gain, classically pleuritic or exertional cp,  orthopnea pnd or arm/hand swelling  or leg swelling, presyncope, palpitations, abdominal pain, anorexia, nausea, vomiting, diarrhea  or change in bowel habits or change in bladder habits, change in stools or change in urine, dysuria, hematuria,  rash, arthralgias, visual complaints, headache, numbness, weakness or ataxia or problems with walking or coordination,  change in mood or  memory.        Current Meds  Medication Sig   albuterol (PROAIR HFA) 108 (90 Base) MCG/ACT inhaler Up to 2 puffs every 4 hours if needed   aspirin EC 81 MG tablet Take 81 mg by mouth daily.    b complex vitamins capsule Take 1 capsule by mouth daily.    Biotin 10000 MCG TABS Take 1 capsule by mouth daily.   CALCIUM PO Take 1 tablet by mouth daily.   Cholecalciferol (VITAMIN D-1000 MAX ST) 25 MCG (1000 UT) tablet Take 1,000 Units by mouth daily.    fluticasone (FLONASE) 50 MCG/ACT nasal spray Place 1 spray into both nostrils daily as needed.    Fluticasone-Umeclidin-Vilant 100-62.5-25 MCG/INH AEPB Inhale 1 puff into the lungs daily.   furosemide (LASIX) 20 MG tablet Take 10 mg by mouth daily as needed.   levalbuterol (XOPENEX) 0.63 MG/3ML nebulizer solution 1 vial 4 x daily as needed   Omega-3 1000 MG CAPS Take 1 capsule by mouth 2 (two) times daily.     OXYGEN 3lpm with sleep and then as needed during the day  [employer]   pravastatin (PRAVACHOL) 20 MG tablet Take 20 mg by mouth daily.   predniSONE (DELTASONE) 10 MG tablet Take  4 each am x 2 days,   2 each am x 2 days,  1 each am x 2 days and stop   sertraline (ZOLOFT) 50 MG tablet Take 50 mg by mouth daily.    telmisartan (MICARDIS) 80 MG tablet Take 1 tablet (80 mg total) by mouth daily.                  Objective:    Chronically ill amb wf nad at rest    01/19/2020        96 09/12/2019        98 03/11/2019        99 10/07/2018          97   06/17/18 95 lb 12.8 oz (43.5 kg)  06/03/18 96 lb 9.6 oz (43.8 kg)  05/27/18 98 lb 9.6 oz (44.7 kg)     Vital signs reviewed  01/19/2020  - Note at rest 02 sats  92% on 4lpm      HEENT : pt wearing mask not removed for exam due to covid -19 concerns.    NECK :  without JVD/Nodes/TM/ nl carotid upstrokes bilaterally   LUNGS: no acc muscle use,  Mod barrel  contour chest wall with bilateral  Distant bs s audible wheeze and  without cough on insp or exp maneuvers and  mod  Hyperresonant  to  percussion bilaterally     CV:  RRR  no s3 or murmur or increase in P2, and no edema   ABD:  soft and nontender with pos mid insp Hoover's  in the supine position. No bruits or organomegaly appreciated, bowel sounds nl  MS:     ext warm without deformities, calf tenderness, cyanosis or clubbing No obvious joint restrictions   SKIN: warm and dry without lesions    NEURO:  alert, approp, nl sensorium with  no motor or cerebellar deficits apparent.        CXR PA and Lateral:   01/19/2020 :    I personally reviewed images and   impression as follows:   Severe copd changes         Assessment

## 2020-01-20 ENCOUNTER — Encounter: Payer: Self-pay | Admitting: Internal Medicine

## 2020-01-20 NOTE — Assessment & Plan Note (Signed)
Quit smoking 2007 - 02 dep 2015  - alpha one AT screen 06/03/2018  MM level 160 - Spirometry 06/17/2018  FEV1 0.6 (32%)  Ratio 0.50  p trelegy in am/ classic curvature   - 10/07/2018  After extensive coaching inhaler device,  effectiveness =    90% with dpi / 75% hfa   - 01/19/2020  After extensive coaching inhaler device,  effectiveness =    50% (Ti too short)  > try breztri 2 pffs x 15 min p am neb saba   Air trapping is limiting IC and best rx would be neb lama/laba/ICS but her insurance won't cover so best other option is above trial with acute rx with pred x 5 days and continue mucinex/flutter valve.  Also re other use of saba: I spent extra time with pt today reviewing appropriate use of albuterol for prn use on exertion with the following points: 1) saba is for relief of sob that does not improve by walking a slower pace or resting but rather if the pt does not improve after trying this first. 2) If the pt is convinced, as many are, that saba helps recover from activity faster then it's easy to tell if this is the case by re-challenging : ie stop, take the inhaler, then p 5 minutes try the exact same activity (intensity of workload) that just caused the symptoms and see if they are substantially diminished or not after saba 3) if there is an activity that reproducibly causes the symptoms, try the saba 15 min before the activity on alternate days   If in fact the saba really does help, then fine to continue to use it prn but advised may need to look closer at the maintenance regimen being used to achieve better control of airways disease with exertion.

## 2020-01-20 NOTE — Assessment & Plan Note (Signed)
02 dep since 2015   As of  01/19/2020 rx =3lpm at rest and up to 5lpm with activity with goal of keeping 02 sats > 90%           Each maintenance medication was reviewed in detail including emphasizing most importantly the difference between maintenance and prns and under what circumstances the prns are to be triggered using an action plan format where appropriate.  Total time for H and P, chart review, counseling, teaching device and generating customized AVS unique to this office visit / charting = 20 min

## 2020-01-23 ENCOUNTER — Other Ambulatory Visit: Payer: Self-pay | Admitting: Internal Medicine

## 2020-01-23 MED ORDER — AZITHROMYCIN 250 MG PO TABS: 250.0000 mg | ORAL_TABLET | ORAL | 0 refills | Status: DC

## 2020-01-23 NOTE — Progress Notes (Signed)
Spoke with pt and notified of results per Dr. Wert. Pt verbalized understanding and denied any questions. 

## 2020-02-14 DIAGNOSIS — J449 Chronic obstructive pulmonary disease, unspecified: Secondary | ICD-10-CM | POA: Diagnosis not present

## 2020-02-28 ENCOUNTER — Ambulatory Visit: Payer: PPO | Admitting: Internal Medicine

## 2020-03-12 ENCOUNTER — Ambulatory Visit: Payer: PPO | Admitting: Internal Medicine

## 2020-03-12 ENCOUNTER — Other Ambulatory Visit: Payer: Self-pay

## 2020-03-12 ENCOUNTER — Encounter: Payer: Self-pay | Admitting: Internal Medicine

## 2020-03-12 DIAGNOSIS — J449 Chronic obstructive pulmonary disease, unspecified: Secondary | ICD-10-CM | POA: Diagnosis not present

## 2020-03-12 DIAGNOSIS — J9611 Chronic respiratory failure with hypoxia: Secondary | ICD-10-CM | POA: Diagnosis not present

## 2020-03-12 MED ORDER — PREDNISONE 10 MG PO TABS: ORAL_TABLET | ORAL | 0 refills | Status: DC

## 2020-03-12 MED ORDER — BREZTRI AEROSPHERE 160-9-4.8 MCG/ACT IN AERO: 2.0000 | INHALATION_SPRAY | Freq: Two times a day (BID) | RESPIRATORY_TRACT | 0 refills | Status: DC

## 2020-03-12 NOTE — Assessment & Plan Note (Signed)
02 dep since 2015   -  03/12/2020 :  Saturations on Room Air at Rest = 85%--increased to 92%5lpm pulsed o2 and walked 1 lap with sats and end 92%5lpm pulsed   rec as of 03/12/2020 :   3lpm hs, ok at rest if sats > 90% RA and 5lpm POC with walking  Advised again: Make sure you check your oxygen saturations at highest level of activity to be sure it stays over 90% and adjust  02 flow upward to maintain this level if needed but remember to turn it back to previous settings when you stop (to conserve your supply).           Each maintenance medication was reviewed in detail including emphasizing most importantly the difference between maintenance and prns and under what circumstances the prns are to be triggered using an action plan format where appropriate.  Total time for H and P, chart review, counseling, teaching device and generating customized AVS unique to this office visit / charting = 32 min

## 2020-03-12 NOTE — Assessment & Plan Note (Signed)
Quit smoking 2007 - 02 dep 2015  - alpha one AT screen 06/03/2018  MM level 160 - Spirometry 06/17/2018  FEV1 0.6 (32%)  Ratio 0.50  p trelegy in am/ classic curvature   - 10/07/2018  After extensive coaching inhaler device,  effectiveness =    90% with dpi / 75% hfa   - 01/19/2020  After extensive coaching inhaler device,  effectiveness =    50% (Ti too short)  > try breztri 2 pffs x 15 min p am neb saba > did not time as advised - 03/12/2020  After extensive coaching inhaler device,  effectiveness =    75% rechallnge with breztri x 2 weeks and if not better back to trelegy but either way both are 1st thing in am as she doesn't get sob until starts her am activities    Group D in terms of symptom/risk and laba/lama/ICS  therefore appropriate rx at this point >>>  trelegy or breztri but either way needs to use first thing in am then rinse/ gargle and if still having throat irritation then gargle with arm and hammer toothpaste slurry.  Re saba I spent extra time with pt today reviewing appropriate use of albuterol for prn use on exertion with the following points: 1) saba is for relief of sob that does not improve by walking a slower pace or resting but rather if the pt does not improve after trying this first. 2) If the pt is convinced, as many are, that saba helps recover from activity faster then it's easy to tell if this is the case by re-challenging : ie stop, take the inhaler, then p 5 minutes try the exact same activity (intensity of workload) that just caused the symptoms and see if they are substantially diminished or not after saba 3) if there is an activity that reproducibly causes the symptoms, try the saba 15 min before the activity on alternate days   If in fact the saba really does help, then fine to continue to use it prn but advised may need to look closer at the maintenance regimen being used to achieve better control of airways disease with exertion.

## 2020-03-12 NOTE — Progress Notes (Signed)
Rachel Evans, female    DOB: March 07, 1941     MRN: [id number]   Brief patient profile:  26  yowf  MM/quit smoking 2007 with doe to point where trouble steps but did not req 02 or meds but gradually worse and by  2015 rx= various inhalers and even 02 but didn't use consistently with doe = MMRC2   then acutely worse late Oct 2019 green sputum on trelegy rx levaquin > sore leg tendons so rx augmentin which helped mucus but breathing worse rx nebulizer xopenex then admitted 04/26/18 with "pna/sepsis"@ [place] [facility]  Floor bed > d/c May 03 2018 on IV ancef which was complete Jan 15th 2020 and referred to pulmonary clinic 06/03/2018 by Dr   Gilford Rile.with GOLD III criteria by spirometry 06/17/2018     History of Present Illness  06/03/2018  Pulmonary/ 1st office eval/Rachel Evans  Chief Complaint  Patient presents with  . Pulmonary Consult    Referred by Dr. Gilford Rile. Pt states had PNA 04/26/18. She c/o SOB "sometimes"- occ gets winded walking room to room. She has occ cough with clear sputum.   Dyspnea:  MMRC3 = can't walk 100 yards even at a slow pace at a flat grade s stopping due to sob   Cough: clear /no am flares Sleep: on side hob on 1 pillow flat bed  SABA use: not using proair/ has neb with levoalbuterol / back on trelgy now / a bit confused when when / how to use saba  02  None at rest/ 3lpm hs and 3 pulsed  out Typically sats are maint  low 90s at rest RA and upper 80s on POC rec Plan A = Automatic = Trelegy one click daily  Plan B = Backup Only use your albuterol(proair) inhaler as a rescue medication  Work on inhaler technique:  Plan C = Crisis - only use your levoalbuterol nebulizer if you first try Plan B    06/17/2018  f/u ov/Rachel Evans re: GOLD 3 / 02 dep hs and ex on trelegy maint  Chief Complaint  Patient presents with  . Follow-up    breathing has been worse over the past 2 days. Her voice has been hoarse but she is not coughing much. She rarely uses her albuterol inhaler or xopenex  neb.   Dyspnea:  Was able to cross parking lot into [employer] s 02 and shopped  Cough: dry raspy daytime aggravated by voice use (on ACEi)  Sleeping: on side flat bed/ one pillow  SABA use: none  02:  Sleeping on 2.5,  At rest 92%  RA and most she does is usually 4lpm rec Adjust your 02 to whatever you need to keep your saturation above 90%  Your lisinopril may cause tickle/ hoarseness/ cough that mimics copd / asthma    10/07/2018  f/u ov/Rachel Evans re: copd GOLD III on trelegy and ACEi  Chief Complaint  Patient presents with  . Follow-up    COPD. She reports not having to use her rescue inhaler. She does get DOE.   Dyspnea: doe hc parking/ [employer] slowly = MMRC2 = can't walk a nl pace on a flat grade s sob but does fine slow and flat  Cough: dry daytime assoc with hoarseness  Sleeping: 2 pillow no problme SABA use: much less now  02: 2lpm hs and prn though not   monitoring sats as rec  rec Consider changing zestoretic to any number of suitable alternatives that don't contain ACE inhibitors which  tend to mimic all your freq copd symptoms and make it difficult to sort out where exactly they are coming from     03/11/2019  f/u ov/Rachel Evans re: copd on trelegy daily off acei x 02/16/19  Chief Complaint  Patient presents with  . Follow-up    Breathing has improved since the last visit. She rarely uses her albuterol inhaler, but uses her xopenex neb 1-2 x per day.   Dyspnea:  Several  Hundred feet to garden sometime with and sometimes without 02 Cough: sporadic not worse in am / clear mucus - better than it was  Sleeping: fetal bed is flat no resp problems hs  SABA use: never uses pre ex/ changed neb to bid not using hfa prn  But  hfa is poor  02:  2.5 lpm hs /  02 sats at rest typically ok s 02   rec Make sure you check your oxygen saturations at highest level of activity to be sure it stays over 90% and adjust upward to maintain this level  Plan A = Automatic = Always=    Trelegy one click  each am  Plan B = Backup (to supplement plan A, not to replace it) Only use your albuterol inhaler as a rescue medication Plan C = Crisis (instead of Plan B but only if Plan B stops working) - only use your albuterol nebulizer if you first try Plan B and it fails to help > ok to use the nebulizer up to every 4 hours but if start needing it regularly call for immediate appointment Please remember to go to the lab department   for your tests - we will call you with the results when they are available.  Please schedule a follow up visit in 6  months but call sooner if needed Add Na down to 124 so d/c hctz and change micardis to 80 mg (break in half if too strong) and use lasix prn swelling/restrict fluids     09/12/2019  f/u ov/Rachel Evans re:   COPD III / 02 dep lost ground since rehab/ covid shots jan / feb 2021 maint on trelegy / better p pred rx 08/10/19  Dyspnea:  Walking around the house makes her sob   Cough: none Sleeping: flat bed /one pillow, no resp symptoms  SABA use: never prechallenge  02: 3lpm at hs and usually not higher than 4 walking rec Try albuterol (either the puffer or neb)  15 min before   Make sure you check your oxygen saturations at highest level of activity  Prednisone 10 mg take  4 each am x 2 days,   2 each am x 2 days,  1 each am x 2 days and stop      01/19/2020  f/u ov/Rachel Evans re:  Copd III / 02 dep / trelegy maint  Chief Complaint  Patient presents with  . Follow-up    COPD, SOB is worse  Dyspnea:  Sob 50 ft  Cough: no  Sleeping: bed flat, big pillow  SABA use: never prechallenges  02: 2-3 lpm and 4-5 with activity  rec For cough > mucinex 1200 mg every 12 hours as needed and use the flutter valve  Plan A = Automatic = Always=    Breztri Take 2 puffs first thing in am and then another 2 puffs about 12 hours later.  Work on inhaler technique:  Prednisone 10 mg Take 4 for three days 3 for three days 2 for three days 1 for three  days and stop  Plan B = Backup (to  supplement plan A, not to replace it) Only use your albuterol inhaler as a rescue medication Plan C = Crisis (instead of Plan B but only if Plan B stops working) - only use your albuterol nebulizer if you first try Plan B and it fails to help > ok to use the nebulizer up to every 4 hours but if start needing it regularly call for immediate appointment   Please schedule a follow up office visit in 4 weeks, sooner if needed     03/12/2020  f/u ov/Rachel Evans re:  COPD GOLD III  Back on trelegy / did not time breztri correctly / doesn't use trelegy first thing in am either. Chief Complaint  Patient presents with  . Follow-up    Breathing is slightly improved since the last visit. She has occ cough with clear sputum.    Dyspnea:  50 ft and stops even on 02 up to 5lpm / ? not much better if times walk  p saba but usually walks then uses saba at rest Cough: some sense of pnds x 1 week Sleeping: flat bed big pillow sleeps fine  SABA use: not using as rec prn  02: 3lpm hs / 0 at rest up to 5 lpm walking    No obvious day to day or daytime variability or assoc excess/ purulent sputum or mucus plugs or hemoptysis or cp or chest tightness, subjective wheeze or overt sinus or hb symptoms.   Sleeping fine without nocturnal  or early am exacerbation  of respiratory  c/o's or need for noct saba. Also denies any obvious fluctuation of symptoms with weather or environmental changes or other aggravating or alleviating factors except as outlined above   No unusual exposure hx or h/o childhood pna/ asthma or knowledge of premature birth.  Current Allergies, Complete Past Medical History, Past Surgical History, Family History, and Social History were reviewed in [employer] record.  ROS  The following are not active complaints unless bolded Hoarseness, sore throat, dysphagia, dental problems, itching, sneezing,  nasal congestion or discharge of excess mucus or purulent secretions, ear ache,    fever, chills, sweats, unintended wt loss or wt gain, classically pleuritic or exertional cp,  orthopnea pnd or arm/hand swelling  or leg swelling, presyncope, palpitations, abdominal pain, anorexia, nausea, vomiting, diarrhea  or change in bowel habits or change in bladder habits, change in stools or change in urine, dysuria, hematuria,  rash, arthralgias, visual complaints, headache, numbness, weakness or ataxia or problems with walking or coordination,  change in mood= anxious  or  memory.        Current Meds  Medication Sig  . albuterol (PROAIR HFA) 108 (90 Base) MCG/ACT inhaler Up to 2 puffs every 4 hours if needed  . aspirin EC 81 MG tablet Take 81 mg by mouth daily.   Marland Kitchen b complex vitamins capsule Take 1 capsule by mouth daily.   . Biotin 10000 MCG TABS Take 1 capsule by mouth daily.  . Budeson-Glycopyrrol-Formoterol (BREZTRI AEROSPHERE) 160-9-4.8 MCG/ACT AERO Inhale 2 puffs into the lungs 2 (two) times daily.  Marland Kitchen CALCIUM PO Take 1 tablet by mouth daily.  . Cholecalciferol (VITAMIN D-1000 MAX ST) 25 MCG (1000 UT) tablet Take 1,000 Units by mouth daily.   . fluticasone (FLONASE) 50 MCG/ACT nasal spray Place 1 spray into both nostrils daily as needed.   . furosemide (LASIX) 20 MG tablet Take 10 mg by mouth daily as  needed.  . levalbuterol (XOPENEX) 0.63 MG/3ML nebulizer solution 1 vial 4 x daily as needed  . Omega-3 1000 MG CAPS Take 1 capsule by mouth 2 (two) times daily.   . OXYGEN 3lpm with sleep and then as needed during the day  [facility]  . pravastatin (PRAVACHOL) 20 MG tablet Take 20 mg by mouth daily.  . sertraline (ZOLOFT) 50 MG tablet Take 50 mg by mouth daily.   Marland Kitchen telmisartan (MICARDIS) 80 MG tablet Take 1 tablet (80 mg total) by mouth daily.                     Objective:        amb thin anxious wf nad  03/12/2020        97  01/19/2020        96 09/12/2019        98 03/11/2019        99 10/07/2018          97   06/17/18 95 lb 12.8 oz (43.5 kg)  06/03/18 96 lb  9.6 oz (43.8 kg)  05/27/18 98 lb 9.6 oz (44.7 kg)    Vital signs reviewed  03/12/2020  - Note at rest 02 sats  98% on 3lpm pulsed      HEENT : pt wearing mask not removed for exam due to covid -19 concerns.    NECK :  without JVD/Nodes/TM/ nl carotid upstrokes bilaterally   LUNGS: no acc muscle use,  Mod barrel  contour chest wall with bilateral  Distant bs s audible wheeze and  without cough on insp or exp maneuvers and mod  Hyperresonant  to  percussion bilaterally     CV:  RRR  no s3 or murmur or increase in P2, and no edema   ABD:  soft and nontender with pos mid insp Hoover's  in the supine position. No bruits or organomegaly appreciated, bowel sounds nl  MS:     ext warm without deformities, calf tenderness, cyanosis or clubbing No obvious joint restrictions   SKIN: warm and dry without lesions    NEURO:  alert, approp, nl sensorium with  no motor or cerebellar deficits apparent.         Assessment

## 2020-03-12 NOTE — Patient Instructions (Addendum)
Prednisone 10 mg take  4 each am x 2 days,   2 each am x 2 days,  1 each am x 2 days and stop   If sense of postnasal of drainage doesn't get better it probably is the trelegy causing the problem   Plan A = Automatic = Always=    Trelegy one click each am  (or Breztri  Take 2 puffs first thing in am and then another 2 puffs about 12 hours later)     Work on inhaler technique:  relax and gently blow all the way out then take a nice smooth deep breath back in, triggering the inhaler at same time you start breathing in.  Hold for up to 5 seconds if you can. Blow out thru nose. Rinse and gargle with water when done  Plan B = Backup (to supplement plan A, not to replace it) Only use your albuterol inhaler as a rescue medication to be used if you can't catch your breath by resting or doing a relaxed purse lip breathing pattern.  - The less you use it, the better it will work when you need it. - Ok to use the inhaler up to 2 puffs  every 4 hours if you must but call for appointment if use goes up over your usual need - Don't leave home without it !!  (think of it like the spare tire for your car)    Plan C = Crisis (instead of Plan B but only if Plan B stops working) - only use your albuterol nebulizer if you first try Plan B and it fails to help > ok to use the nebulizer up to every 4 hours but if start needing it regularly call for immediate appointment  Try albuterol 15 min before an activity (alternating days)  that you know would make you short of breath and see if it makes any difference and if makes none then don't take it after activity unless you can't catch your breath.     Please schedule a follow up visit in 6  months but call sooner if needed

## 2020-03-16 DIAGNOSIS — J449 Chronic obstructive pulmonary disease, unspecified: Secondary | ICD-10-CM | POA: Diagnosis not present

## 2020-04-03 DIAGNOSIS — F339 Major depressive disorder, recurrent, unspecified: Secondary | ICD-10-CM | POA: Diagnosis not present

## 2020-04-03 DIAGNOSIS — Z0000 Encounter for general adult medical examination without abnormal findings: Secondary | ICD-10-CM | POA: Diagnosis not present

## 2020-04-03 DIAGNOSIS — J449 Chronic obstructive pulmonary disease, unspecified: Secondary | ICD-10-CM | POA: Diagnosis not present

## 2020-04-03 DIAGNOSIS — M8580 Other specified disorders of bone density and structure, unspecified site: Secondary | ICD-10-CM | POA: Diagnosis not present

## 2020-04-03 DIAGNOSIS — I10 Essential (primary) hypertension: Secondary | ICD-10-CM | POA: Diagnosis not present

## 2020-04-03 DIAGNOSIS — E782 Mixed hyperlipidemia: Secondary | ICD-10-CM | POA: Diagnosis not present

## 2020-04-12 ENCOUNTER — Encounter: Payer: Self-pay | Admitting: Internal Medicine

## 2020-04-12 ENCOUNTER — Telehealth: Payer: Self-pay | Admitting: Internal Medicine

## 2020-04-12 MED ORDER — TRELEGY ELLIPTA 100-62.5-25 MCG/INH IN AEPB: 1.0000 | INHALATION_SPRAY | Freq: Every day | RESPIRATORY_TRACT | 2 refills | Status: DC

## 2020-04-12 NOTE — Telephone Encounter (Signed)
Called and spoke with Patient. Patient requested a refill for Trelegy.  Patient stated she was given samples of Breztri to try at [place] with Dr. Melvyn Novas, but Patient stated she did not feel her breathing was as good with Judithann Sauger, as it is with Trelegy.  Patient stated Dr. Melvyn Novas told her she could go back to Trelegy after she tried [employer], [employer]. Patient request Trelegy refill to be sent to [facility].  Trelegy prescription sent to requested pharmacy.  Nothing further at this time.  LOV 03/12/20-MW  Instructions  Prednisone 10 mg take  4 each am x 2 days,   2 each am x 2 days,  1 each am x 2 days and stop   If sense of postnasal of drainage doesn't get better it probably is the trelegy causing the problem   Plan A = Automatic = Always=    Trelegy one click each am  (or Breztri  Take 2 puffs first thing in am and then another 2 puffs about 12 hours later)     Work on inhaler technique:  relax and gently blow all the way out then take a nice smooth deep breath back in, triggering the inhaler at same time you start breathing in.  Hold for up to 5 seconds if you can. Blow out thru nose. Rinse and gargle with water when done  Plan B = Backup (to supplement plan A, not to replace it) Only use your albuterol inhaler as a rescue medication to be used if you can't catch your breath by resting or doing a relaxed purse lip breathing pattern.  - The less you use it, the better it will work when you need it. - Ok to use the inhaler up to 2 puffs  every 4 hours if you must but call for appointment if use goes up over your usual need - Don't leave home without it !!  (think of it like the spare tire for your car)    Plan C = Crisis (instead of Plan B but only if Plan B stops working) - only use your albuterol nebulizer if you first try Plan B and it fails to help > ok to use the nebulizer up to every 4 hours but if start needing it regularly call for immediate appointment  Try albuterol 15 min before  an activity (alternating days)  that you know would make you short of breath and see if it makes any difference and if makes none then don't take it after activity unless you can't catch your breath.     Please schedule a follow up visit in 6  months but call sooner if needed

## 2020-04-12 NOTE — Telephone Encounter (Signed)
Patient is returning phone call. Patient phone number is [phone].

## 2020-04-12 NOTE — Telephone Encounter (Signed)
04/12/2020  Attempted to reach patient.  Left detailed voicemail.  Instructed patient to contact our office back.  We will leave in triage for 1 more attempt on 04/13/2020.  Wyn Quaker, FNP

## 2020-04-15 DIAGNOSIS — J449 Chronic obstructive pulmonary disease, unspecified: Secondary | ICD-10-CM | POA: Diagnosis not present

## 2020-04-20 NOTE — Telephone Encounter (Signed)
Nothing noted in message. Will close encounter due to error.

## 2020-04-30 ENCOUNTER — Telehealth: Payer: Self-pay | Admitting: Internal Medicine

## 2020-04-30 NOTE — Telephone Encounter (Signed)
Spoke with the pt  She states just found out that 2 people that she celebrated Christmas with just tested pos for covid today  She has been vaccinated and is not currently having any symptoms  I advised since asymptomatic wait 5 days from exposure and then get tested  I advised her to let us know if she tests positive and will consider MAB  She verbalized understanding and nothing further needed

## 2020-05-16 DIAGNOSIS — J449 Chronic obstructive pulmonary disease, unspecified: Secondary | ICD-10-CM | POA: Diagnosis not present

## 2020-06-04 DIAGNOSIS — L578 Other skin changes due to chronic exposure to nonionizing radiation: Secondary | ICD-10-CM | POA: Diagnosis not present

## 2020-06-04 DIAGNOSIS — L570 Actinic keratosis: Secondary | ICD-10-CM | POA: Diagnosis not present

## 2020-06-04 DIAGNOSIS — L720 Epidermal cyst: Secondary | ICD-10-CM | POA: Diagnosis not present

## 2020-06-04 DIAGNOSIS — L821 Other seborrheic keratosis: Secondary | ICD-10-CM | POA: Diagnosis not present

## 2020-06-16 DIAGNOSIS — J449 Chronic obstructive pulmonary disease, unspecified: Secondary | ICD-10-CM | POA: Diagnosis not present

## 2020-07-14 DIAGNOSIS — J449 Chronic obstructive pulmonary disease, unspecified: Secondary | ICD-10-CM | POA: Diagnosis not present

## 2020-07-18 ENCOUNTER — Telehealth: Payer: Self-pay | Admitting: Internal Medicine

## 2020-07-18 MED ORDER — PREDNISONE 10 MG PO TABS: ORAL_TABLET | ORAL | 0 refills | Status: DC

## 2020-07-18 MED ORDER — LEVALBUTEROL HCL 0.63 MG/3ML IN NEBU: 0.6300 mg | INHALATION_SOLUTION | Freq: Four times a day (QID) | RESPIRATORY_TRACT | 1 refills | Status: DC | PRN

## 2020-07-18 NOTE — Telephone Encounter (Signed)
Spoke with pt, c/o increased SOB with exertion, "fullness" feeling in her lungs, prod cough with clear mucus with yellow thick plug "every other morning".  Denies fever, sharp chest pains.  Pt is vaccinated X3 for covid, has received flu shot this season.  Pt has been taking Trelegy QD, mucinex daily.  Pt does not have an albuterol inhaler at home. Requesting prednisone and any additional recs.  Pharmacy: [facility] in [place]  MW please advise on recs.  Thanks!

## 2020-07-18 NOTE — Telephone Encounter (Signed)
Prednisone 10 mg take  4 each am x 2 days,   2 each am x 2 days,  1 each am x 2 days and stop   Go over her last ov with her  ABC plan includes both albuterol hfa and neb so make sure she has refills

## 2020-07-18 NOTE — Telephone Encounter (Signed)
Called and spoke with patient. She verbalized understanding of recs. She has the albuterol inhaler with refills. She stated that she does not have any nebulizer solution but would like a RX. On the AVS it mentions albuterol but on her medication chart, it mentions levalbuterol.

## 2020-07-18 NOTE — Telephone Encounter (Signed)
Spoke with Arista at pharmacy and relayed message from Dr Melvyn Novas. Arista expressed full understanding. Nothing further needed at this time.

## 2020-07-18 NOTE — Telephone Encounter (Signed)
Does not matter to me but I believe this is prn and should not need massive quantities so ok to give 75 cc at at time - maybe 2 boxes at most

## 2020-07-18 NOTE — Telephone Encounter (Signed)
Called and spoke with Arista at pharmacy who states has question about quanity of Nebulizer solution. Order was for 270 ML and they have 75 ML per box. Pharmacy is [facility]. Arista phone number is [phone].  Dr. Melvyn Novas please advise

## 2020-07-20 ENCOUNTER — Telehealth: Payer: Self-pay

## 2020-07-20 NOTE — Telephone Encounter (Signed)
Pharmacy name: [facility] Drug requested: levalbuterol neb solution CMM?: yes Key: [id number] Covered alternatives: albuterol Tried and failed: on file Decision: sent to plan. Routing to [facility] for follow-up.

## 2020-07-30 ENCOUNTER — Telehealth: Payer: Self-pay | Admitting: Internal Medicine

## 2020-07-30 NOTE — Telephone Encounter (Signed)
Nothing else to offer over the phone  - if can't get comfortable at rest on 02 p neb rx then need go to er otherwise just continue to the neb up to every 4 hours as per previous instruction and ov asap with provider here

## 2020-07-30 NOTE — Telephone Encounter (Signed)
Left message for patient to call back  

## 2020-07-30 NOTE — Telephone Encounter (Signed)
Primary Pulmonologist: Wert Last office visit and with whom: 03/12/20  Wert What do we see them for (pulmonary problems): COPD Last OV assessment/plan:  Assessment & Plan Note by Tanda Rockers, MD at 03/12/2020 12:50 PM  Author: Tanda Rockers, MD Author Type: Physician Filed: 03/12/2020 12:51 PM  Note Status: Written Cosign: Cosign Not Required Encounter Date: 03/12/2020  Problem: Chronic respiratory failure with hypoxia ([place])  Editor: Tanda Rockers, MD (Physician)               02 dep since 2015   -  03/12/2020 :  Saturations on Room Air at Rest = 85%--increased to 92%5lpm pulsed o2 and walked 1 lap with sats and end 92%5lpm pulsed   rec as of 03/12/2020 :   3lpm hs, ok at rest if sats > 90% RA and 5lpm POC with walking  Advised again: Make sure you check your oxygen saturations at highest level of activity to be sure it stays over 90% and adjust  02 flow upward to maintain this level if needed but remember to turn it back to previous settings when you stop (to conserve your supply).           Each maintenance medication was reviewed in detail including emphasizing most importantly the difference between maintenance and prns and under what circumstances the prns are to be triggered using an action plan format where appropriate.  Total time for H and P, chart review, counseling, teaching device and generating customized AVS unique to this office visit / charting = 32 min            Assessment & Plan Note by Tanda Rockers, MD at 03/12/2020 12:48 PM  Author: Tanda Rockers, MD Author Type: Physician Filed: 03/12/2020 12:49 PM  Note Status: Written Cosign: Cosign Not Required Encounter Date: 03/12/2020  Problem: COPD GOLD III  Editor: Tanda Rockers, MD (Physician)               Quit smoking 2007 - 02 dep 2015  - alpha one AT screen 06/03/2018  MM level 160 - Spirometry 06/17/2018  FEV1 0.6 (32%)  Ratio 0.50  p trelegy in am/ classic curvature   - 10/07/2018  After  extensive coaching inhaler device,  effectiveness =    90% with dpi / 75% hfa   - 01/19/2020  After extensive coaching inhaler device,  effectiveness =    50% (Ti too short)  > try breztri 2 pffs x 15 min p am neb saba > did not time as advised - 03/12/2020  After extensive coaching inhaler device,  effectiveness =    75% rechallnge with breztri x 2 weeks and if not better back to trelegy but either way both are 1st thing in am as she doesn't get sob until starts her am activities    Group D in terms of symptom/risk and laba/lama/ICS  therefore appropriate rx at this point >>>  trelegy or breztri but either way needs to use first thing in am then rinse/ gargle and if still having throat irritation then gargle with arm and hammer toothpaste slurry.  Re saba I spent extra time with pt today reviewing appropriate use of albuterol for prn use on exertion with the following points: 1) saba is for relief of sob that does not improve by walking a slower pace or resting but rather if the pt does not improve after trying this first. 2) If the pt is convinced, as many are, that  saba helps recover from activity faster then it's easy to tell if this is the case by re-challenging : ie stop, take the inhaler, then p 5 minutes try the exact same activity (intensity of workload) that just caused the symptoms and see if they are substantially diminished or not after saba 3) if there is an activity that reproducibly causes the symptoms, try the saba 15 min before the activity on alternate days   If in fact the saba really does help, then fine to continue to use it prn but advised may need to look closer at the maintenance regimen being used to achieve better control of airways disease with exertion.           Patient Instructions by Tanda Rockers, MD at 03/12/2020 11:15 AM  Author: Tanda Rockers, MD Author Type: Physician Filed: 03/12/2020 11:43 AM  Note Status: Addendum Cosign: Cosign Not Required  Encounter Date: 03/12/2020  Editor: Tanda Rockers, MD (Physician)      Prior Versions: 1. Tanda Rockers, MD (Physician) at 03/12/2020 11:41 AM - Addendum   2. Tanda Rockers, MD (Physician) at 03/12/2020 11:40 AM - Addendum   3. Tanda Rockers, MD (Physician) at 03/12/2020 11:34 AM - Signed    Prednisone 10 mg take  4 each am x 2 days,   2 each am x 2 days,  1 each am x 2 days and stop   If sense of postnasal of drainage doesn't get better it probably is the trelegy causing the problem   Plan A = Automatic = Always=    Trelegy one click each am  (or Breztri  Take 2 puffs first thing in am and then another 2 puffs about 12 hours later)     Work on inhaler technique:  relax and gently blow all the way out then take a nice smooth deep breath back in, triggering the inhaler at same time you start breathing in.  Hold for up to 5 seconds if you can. Blow out thru nose. Rinse and gargle with water when done  Plan B = Backup (to supplement plan A, not to replace it) Only use your albuterol inhaler as a rescue medication to be used if you can't catch your breath by resting or doing a relaxed purse lip breathing pattern.  - The less you use it, the better it will work when you need it. - Ok to use the inhaler up to 2 puffs  every 4 hours if you must but call for appointment if use goes up over your usual need - Don't leave home without it !!  (think of it like the spare tire for your car)    Plan C = Crisis (instead of Plan B but only if Plan B stops working) - only use your albuterol nebulizer if you first try Plan B and it fails to help > ok to use the nebulizer up to every 4 hours but if start needing it regularly call for immediate appointment  Try albuterol 15 min before an activity (alternating days)  that you know would make you short of breath and see if it makes any difference and if makes none then don't take it after activity unless you can't catch your breath.     Please schedule  a follow up visit in 6  months but call sooner if needed         Instructions  Prednisone 10 mg take  4 each am x 2  days,   2 each am x 2 days,  1 each am x 2 days and stop   If sense of postnasal of drainage doesn't get better it probably is the trelegy causing the problem   Plan A = Automatic = Always=    Trelegy one click each am  (or Breztri  Take 2 puffs first thing in am and then another 2 puffs about 12 hours later)     Work on inhaler technique:  relax and gently blow all the way out then take a nice smooth deep breath back in, triggering the inhaler at same time you start breathing in.  Hold for up to 5 seconds if you can. Blow out thru nose. Rinse and gargle with water when done  Plan B = Backup (to supplement plan A, not to replace it) Only use your albuterol inhaler as a rescue medication to be used if you can't catch your breath by resting or doing a relaxed purse lip breathing pattern.  - The less you use it, the better it will work when you need it. - Ok to use the inhaler up to 2 puffs  every 4 hours if you must but call for appointment if use goes up over your usual need - Don't leave home without it !!  (think of it like the spare tire for your car)    Plan C = Crisis (instead of Plan B but only if Plan B stops working) - only use your albuterol nebulizer if you first try Plan B and it fails to help > ok to use the nebulizer up to every 4 hours but if start needing it regularly call for immediate appointment  Try albuterol 15 min before an activity (alternating days)  that you know would make you short of breath and see if it makes any difference and if makes none then don't take it after activity unless you can't catch your breath.     Please schedule a follow up visit in 6  months but call sooner if needed         Reason for call: Patient has been increasingly sob for the past 2 weeks.  She was prescribed prednisone for 6 days, she saw maybe a little  improvement and then was right back where she started.  She has had to wear her oxygen more often if she is doing anything to exert herself.  She has been on 3L at rest and 5L with exertion.  Any type of exertion wears her out.  She has a cough, the mucous was clear and now she has no mucous.  States she started running a low grade temp of 99 today.  States her lungs feel tight.  She is using her Levo albuterol nebulizer everyday for the past week with relief for about an hour.  She is taking her Lasix once a day.  States she does have some swelling in her feet at the end of the day.  She has had all of her covid vaccines and booster, flu vaccine and pna vaccine.    Dr. Melvyn Novas, please advise.  Thank you.  (examples of things to ask: : When did symptoms start? Fever? Cough? Productive? Color to sputum? More sputum than usual? Wheezing? Have you needed increased oxygen? Are you taking your respiratory medications? What over the counter measures have you tried?)  Allergies  Allergen Reactions  . Levofloxacin Other (See Comments)    Leg pain and hardness    Immunization  History  Administered Date(s) Administered  . Influenza, High Dose Seasonal PF 02/05/2020  . Influenza, Quadrivalent, Recombinant, Inj, Pf 12/31/2018  . Influenza,inj,Quad PF,6+ Mos 01/16/2015  . Influenza-Unspecified 02/06/2017, 02/04/2018  . Pneumococcal Conjugate-13 03/05/2013  . Pneumococcal Polysaccharide-23 05/06/2007, 11/27/2016  . Tetanus 05/06/2007  . Zoster 05/06/2007  . Zoster Recombinat (Shingrix) 11/06/2016

## 2020-07-31 NOTE — Telephone Encounter (Signed)
Called and spoke with Patient. Dr. Gustavus Bryant recommendations given. Understanding stated. Patient stated she would go to ED in [place] to be checked out. Nothing further at this time.

## 2020-07-31 NOTE — Telephone Encounter (Signed)
Pt returning a phone call. Pt can be reached at [phone]

## 2020-08-03 ENCOUNTER — Telehealth: Payer: Self-pay | Admitting: Internal Medicine

## 2020-08-03 MED ORDER — ALBUTEROL SULFATE (2.5 MG/3ML) 0.083% IN NEBU: 1.2500 mg | INHALATION_SOLUTION | Freq: Four times a day (QID) | RESPIRATORY_TRACT | 12 refills | Status: DC | PRN

## 2020-08-03 NOTE — Telephone Encounter (Signed)
Ok to use albuterol but should only use 1.25 mg or half  A vial of the 2.5 as this is the equivalent of the xopenex (levoalbuterol) in terms of side effects

## 2020-08-03 NOTE — Telephone Encounter (Signed)
Called pt's pharmacy and spoke with pharmacist Marcene Brawn letting her know that it is okay to dispense 138ml albuterol sol for pt. Marcene Brawn verbalized understanding. Nothing further needed.

## 2020-08-03 NOTE — Telephone Encounter (Signed)
Called and spoke with pt letting her know the results of the PA for the levalbuterol solution. Stated to her that we were going to send in albuterol neb sol as it was the alternate. Stated to her that she is to take 1/2 vial instead of a full vial and she verbalized understanding. Verified preferred pharmacy and sent Rx in for pt. Nothing further needed.

## 2020-08-03 NOTE — Telephone Encounter (Signed)
Checked status of PA, PA was denied on 3/19. No reason for denial given on [email or web address].  MW please advise if ok to prescribe albuterol instead, as this is the covered alternative. Thanks!

## 2020-08-14 DIAGNOSIS — J449 Chronic obstructive pulmonary disease, unspecified: Secondary | ICD-10-CM | POA: Diagnosis not present

## 2020-08-19 ENCOUNTER — Telehealth: Payer: Self-pay | Admitting: *Deleted

## 2020-08-19 NOTE — Telephone Encounter (Signed)
PA papers received for the albuterol nebulizer solution.  These have been completed and will be placed in MW sign folder.  Will forward to LR and MW.

## 2020-08-23 NOTE — Telephone Encounter (Signed)
Form in Dr Gustavus Bryant lookat in [place] to be singed

## 2020-08-29 NOTE — Telephone Encounter (Signed)
Form has been signed, faxed and placed in scan folder  Will hold in my basket to await outcome

## 2020-08-30 DIAGNOSIS — C44722 Squamous cell carcinoma of skin of right lower limb, including hip: Secondary | ICD-10-CM | POA: Diagnosis not present

## 2020-08-30 DIAGNOSIS — L821 Other seborrheic keratosis: Secondary | ICD-10-CM | POA: Diagnosis not present

## 2020-08-30 DIAGNOSIS — L570 Actinic keratosis: Secondary | ICD-10-CM | POA: Diagnosis not present

## 2020-08-30 NOTE — Telephone Encounter (Signed)
A fax was received in MW's look-at that states: "There is an existing approval on file for this medication. This is covered until 05/04/2021".   Pharmacy aware of rx approval. Nothing further needed at this time- will close encounter.

## 2020-09-10 ENCOUNTER — Encounter: Payer: Self-pay | Admitting: Internal Medicine

## 2020-09-10 ENCOUNTER — Ambulatory Visit (INDEPENDENT_AMBULATORY_CARE_PROVIDER_SITE_OTHER): Payer: PPO

## 2020-09-10 ENCOUNTER — Other Ambulatory Visit: Payer: Self-pay

## 2020-09-10 ENCOUNTER — Ambulatory Visit: Payer: PPO | Admitting: Internal Medicine

## 2020-09-10 DIAGNOSIS — J449 Chronic obstructive pulmonary disease, unspecified: Secondary | ICD-10-CM | POA: Diagnosis not present

## 2020-09-10 DIAGNOSIS — J9611 Chronic respiratory failure with hypoxia: Secondary | ICD-10-CM

## 2020-09-10 DIAGNOSIS — R059 Cough, unspecified: Secondary | ICD-10-CM | POA: Diagnosis not present

## 2020-09-10 DIAGNOSIS — J439 Emphysema, unspecified: Secondary | ICD-10-CM | POA: Diagnosis not present

## 2020-09-10 MED ORDER — AZITHROMYCIN 250 MG PO TABS: ORAL_TABLET | ORAL | 0 refills | Status: DC

## 2020-09-10 MED ORDER — PREDNISONE 10 MG PO TABS: ORAL_TABLET | ORAL | 0 refills | Status: DC

## 2020-09-10 NOTE — Progress Notes (Signed)
Rachel Evans, female    DOB: [birth year]     MRN: [id number]   Brief patient profile:  41  yowf  MM/quit smoking 2007 with doe to point where trouble steps but did not req 02 or meds but gradually worse and by  2015 rx= various inhalers and even 02 but didn't use consistently with doe = MMRC2   then acutely worse late Oct 2019 green sputum on trelegy rx levaquin > sore leg tendons so rx augmentin which helped mucus but breathing worse rx nebulizer xopenex then admitted 04/26/18 with "pna/sepsis"@ [facility]  Floor bed > d/c May 03 2018 on IV ancef which was complete Jan 15th 2020 and referred to pulmonary clinic 06/03/2018 by Dr   Gilford Rile.with GOLD III criteria by spirometry 06/17/2018     History of Present Illness  06/03/2018  Pulmonary/ 1st office eval/Samaiya [facility]adall[facility]  Chief Complaint  Patient presents with  . Pulmonary Consult    Referred by Dr. Gilford Rile. Pt states had PNA 04/26/18. She c/o SOB "sometimes"- occ gets winded walking room to room. She has occ cough with clear sputum.   Dyspnea:  MMRC3 = can't walk 100 yards even at a slow pace at a flat grade s stopping due to sob   Cough: clear /no am flares Sleep: on side hob on 1 pillow flat bed  SABA use: not using proair/ has neb with levoalbuterol / back on trelgy now / a bit confused when when / how to use saba  02  None at rest/ 3lpm hs and 3 pulsed  out Typically sats are maint  low 90s at rest RA and upper 80s on POC rec Plan A = Automatic = Trelegy one click daily  Plan B = Backup Only use your albuterol(proair) inhaler as a rescue medication  Work on inhaler technique:  Plan C = Crisis - only use your levoalbuterol nebulizer if you first try Plan B    06/17/2018  f/u ov/Baptiste Littler re: GOLD 3 / 02 dep hs and ex on trelegy maint  Chief Complaint  Patient presents with  . Follow-up    breathing has been worse over the past 2 days. Her voice has been hoarse but she is not coughing much. She rarely uses her albuterol inhaler or xopenex  neb.   Dyspnea:  Was able to cross parking lot into [employer] s 02 and shopped  Cough: dry raspy daytime aggravated by voice use (on ACEi)  Sleeping: on side flat bed/ one pillow  SABA use: none  02:  Sleeping on 2.5,  At rest 92%  RA and most she does is usually 4lpm rec Adjust your 02 to whatever you need to keep your saturation above 90%  Your lisinopril may cause tickle/ hoarseness/ cough that mimics copd / asthma    10/07/2018  f/u ov/Selassie Spatafore re: copd GOLD III on trelegy and ACEi  Chief Complaint  Patient presents with  . Follow-up    COPD. She reports not having to use her rescue inhaler. She does get DOE.   Dyspnea: doe hc parking/ [employer] slowly = MMRC2 = can't walk a nl pace on a flat grade s sob but does fine slow and flat  Cough: dry daytime assoc with hoarseness  Sleeping: 2 pillow no problme SABA use: much less now  02: 2lpm hs and prn though not   monitoring sats as rec  rec Consider changing zestoretic to any number of suitable alternatives that don't contain ACE inhibitors which  tend to mimic all your freq copd symptoms and make it difficult to sort out where exactly they are coming from     03/11/2019  f/u ov/Sherryn Pollino re: copd on trelegy daily off acei x 02/16/19  Chief Complaint  Patient presents with  . Follow-up    Breathing has improved since the last visit. She rarely uses her albuterol inhaler, but uses her xopenex neb 1-2 x per day.   Dyspnea:  Several  Hundred feet to garden sometime with and sometimes without 02 Cough: sporadic not worse in am / clear mucus - better than it was  Sleeping: fetal bed is flat no resp problems hs  SABA use: never uses pre ex/ changed neb to bid not using hfa prn  But  hfa is poor  02:  2.5 lpm hs /  02 sats at rest typically ok s 02   rec Make sure you check your oxygen saturations at highest level of activity to be sure it stays over 90% and adjust upward to maintain this level  Plan A = Automatic = Always=    Trelegy one click  each am  Plan B = Backup (to supplement plan A, not to replace it) Only use your albuterol inhaler as a rescue medication Plan C = Crisis (instead of Plan B but only if Plan B stops working) - only use your albuterol nebulizer if you first try Plan B and it fails to help > ok to use the nebulizer up to every 4 hours but if start needing it regularly call for immediate appointment Please remember to go to the lab department   for your tests - we will call you with the results when they are available.  Please schedule a follow up visit in 6  months but call sooner if needed Add Na down to 124 so d/c hctz and change micardis to 80 mg (break in half if too strong) and use lasix prn swelling/restrict fluids     09/12/2019  f/u ov/Teagen Mcleary re:   COPD III / 02 dep lost ground since rehab/ covid shots jan / feb 2021 maint on trelegy / better p pred rx 08/10/19  Dyspnea:  Walking around the house makes her sob   Cough: none Sleeping: flat bed /one pillow, no resp symptoms  SABA use: never prechallenge  02: 3lpm at hs and usually not higher than 4 walking rec Try albuterol (either the puffer or neb)  15 min before   Make sure you check your oxygen saturations at highest level of activity  Prednisone 10 mg take  4 each am x 2 days,   2 each am x 2 days,  1 each am x 2 days and stop      01/19/2020  f/u ov/Devlyn Retter re:  Copd III / 02 dep / trelegy maint  Chief Complaint  Patient presents with  . Follow-up    COPD, SOB is worse  Dyspnea:  Sob 50 ft  Cough: no  Sleeping: bed flat, big pillow  SABA use: never prechallenges  02: 2-3 lpm and 4-5 with activity  rec For cough > mucinex 1200 mg every 12 hours as needed and use the flutter valve  Plan A = Automatic = Always=    Breztri Take 2 puffs first thing in am and then another 2 puffs about 12 hours later.  Work on inhaler technique:  Prednisone 10 mg Take 4 for three days 3 for three days 2 for three days 1 for three  days and stop  Plan B = Backup (to  supplement plan A, not to replace it) Only use your albuterol inhaler as a rescue medication Plan C = Crisis (instead of Plan B but only if Plan B stops working) - only use your albuterol nebulizer if you first try Plan B and it fails to help > ok to use the nebulizer up to every 4 hours but if start needing it regularly call for immediate appointment   Please schedule a follow up office visit in 4 weeks, sooner if needed     03/12/2020  f/u ov/Sana Tessmer re:  COPD GOLD III  Back on trelegy / did not time breztri correctly / doesn't use trelegy first thing in am either. Chief Complaint  Patient presents with  . Follow-up    Breathing is slightly improved since the last visit. She has occ cough with clear sputum.    Dyspnea:  50 ft and stops even on 02 up to 5lpm / ? not much better if times walk  p saba but usually walks then uses saba at rest Cough: some sense of pnds x 1 week Sleeping: flat bed big pillow sleeps fine  SABA use: not using as rec prn  02: 3lpm hs / 0 at rest up to 5 lpm walking  rec Prednisone 10 mg take  4 each am x 2 days,   2 each am x 2 days,  1 each am x 2 days and stop  If sense of postnasal of drainage doesn't get better it probably is the trelegy causing the problem Plan A = Automatic = Always=    Trelegy one click each am  (or Breztri  Take 2 puffs first thing in am and then another 2 puffs about 12 hours later)   Work on inhaler technique:   Plan B = Backup (to supplement plan A, not to replace it) Only use your albuterol inhaler as a rescue medication to be used if you can't catch your breath by resting or doing a relaxed purse lip breathing pattern.  - The less you use it, the better it will work when you need it. - Ok to use the inhaler up to 2 puffs  every 4 hours if you must but call for appointment if use goes up over your usual need - Don't leave home without it !!  (think of it like the spare tire for your car)   Plan C = Crisis (instead of Plan B but only if  Plan B stops working) - only use your albuterol nebulizer if you first try Plan B  Try albuterol 15 min before an activity (alternating days)  that you know would make you short of breath        09/10/2020  f/u ov/Maegen Wigle re:  GOLD III but 02 dep COPD? maint on trelegy  Chief Complaint  Patient presents with  . Follow-up    Breathing has been worse over the past 6 wks. She has been coughing up some light brown sputum for the past month. She states that she gets SOB walking 50 ft. She rarely uses her albuterol inhaler or neb.   Dyspnea:  50 ft does not check sats / worse vs baaseline x 6 weeks assoc with worse cough/ congetion Cough: as above, turned light brown  Sleeping: flat bed / thick pillow does ok SABA use: not using as much lately 02: 3.5 sleeping/ up 4-6lpm when walking  Covid status:   vax x 3  No obvious day to day or daytime variability or assoc  mucus plugs or hemoptysis or cp or chest tightness, subjective wheeze or overt sinus or hb symptoms.   Sleeping  without nocturnal  or early am exacerbation  of respiratory  c/o's or need for noct saba. Also denies any obvious fluctuation of symptoms with weather or environmental changes or other aggravating or alleviating factors except as outlined above   No unusual exposure hx or h/o childhood pna/ asthma or knowledge of premature birth.  Current Allergies, Complete Past Medical History, Past Surgical History, Family History, and Social History were reviewed in [facility] record.  ROS  The following are not active complaints unless bolded Hoarseness, sore throat, dysphagia, dental problems, itching, sneezing,  nasal congestion or discharge of excess mucus or purulent secretions, ear ache,   fever, chills, sweats, unintended wt loss or wt gain, classically pleuritic or exertional cp,  orthopnea pnd or arm/hand swelling  or leg swelling, presyncope, palpitations, abdominal pain, anorexia, nausea, vomiting,  diarrhea  or change in bowel habits or change in bladder habits, change in stools or change in urine, dysuria, hematuria,  rash, arthralgias, visual complaints, headache, numbness, weakness or ataxia or problems with walking or coordination,  change in mood or  memory.        Current Meds  Medication Sig  . albuterol (PROAIR HFA) 108 (90 Base) MCG/ACT inhaler Up to 2 puffs every 4 hours if needed  . albuterol (PROVENTIL) (2.5 MG/3ML) 0.083% nebulizer solution Take 1.5 mLs (1.25 mg total) by nebulization every 6 (six) hours as needed for wheezing or shortness of breath. Use only half of a vial.  . aspirin EC 81 MG tablet Take 81 mg by mouth daily.   Marland Kitchen b complex vitamins capsule Take 1 capsule by mouth daily.   . Biotin 10000 MCG TABS Take 1 capsule by mouth daily.  Marland Kitchen CALCIUM PO Take 1 tablet by mouth daily.  . Cholecalciferol 25 MCG (1000 UT) tablet Take 1,000 Units by mouth daily.   . fluticasone (FLONASE) 50 MCG/ACT nasal spray Place 1 spray into both nostrils daily as needed.   . Fluticasone-Umeclidin-Vilant (TRELEGY ELLIPTA) 100-62.5-25 MCG/INH AEPB Inhale 1 puff into the lungs daily.  . furosemide (LASIX) 20 MG tablet Take 10 mg by mouth daily as needed.  . Omega-3 1000 MG CAPS Take 1 capsule by mouth 2 (two) times daily.   . OXYGEN 3lpm with sleep and then as needed during the day  [facility]  . pravastatin (PRAVACHOL) 20 MG tablet Take 20 mg by mouth daily.  . sertraline (ZOLOFT) 50 MG tablet Take 50 mg by mouth daily.   Marland Kitchen telmisartan (MICARDIS) 80 MG tablet Take 1 tablet (80 mg total) by mouth daily.  . TURMERIC PO Take 1 capsule by mouth daily.                         Objective:        09/10/2020        93 03/12/2020        97  01/19/2020        96 09/12/2019        98 03/11/2019        99 10/07/2018          97   06/17/18 95 lb 12.8 oz (43.5 kg)  06/03/18 96 lb 9.6 oz (43.8 kg)  05/27/18 98 lb 9.6 oz (44.7 kg)    Vital  signs reviewed  09/10/2020  - Note at rest 02  sats  97% on 4lpm Pulsed   General appearance:    Chronically ill elderly wf nad at rest p walking in      HEENT : pt wearing mask not removed for exam due to covid -19 concerns.    NECK :  without JVD/Nodes/TM/ nl carotid upstrokes bilaterally   LUNGS: no acc muscle use,  Mod barrel  contour chest wall with bilateral  Distant bs s audible wheeze and  without cough on insp or exp maneuvers and mod  Hyperresonant  to  percussion bilaterally     CV:  RRR  no s3 or murmur or increase in P2, and no edema   ABD:  soft and nontender with pos mid insp Hoover's  in the supine position. No bruits or organomegaly appreciated, bowel sounds nl  MS:     ext warm without deformities, calf tenderness, cyanosis or clubbing No obvious joint restrictions   SKIN: warm and dry without lesions    NEURO:  alert, approp, nl sensorium with  no motor or cerebellar deficits apparent.        CXR PA and Lateral:   09/10/2020 :    I personally reviewed images and agree with radiology impression as follows:   Emphysematous changes.    Assessment

## 2020-09-10 NOTE — Patient Instructions (Addendum)
Plan A = Automatic = Always=    trelegy one click first thing in MA   Plan B = Backup (to supplement plan A, not to replace it) Only use your albuterol inhaler as a rescue medication to be used if you can't catch your breath by resting or doing a relaxed purse lip breathing pattern.  - The less you use it, the better it will work when you need it. - Ok to use the inhaler up to 2 puffs  every 4 hours if you must but call for appointment if use goes up over your usual need - Don't leave home without it !!  (think of it like the spare tire for your car)   Plan C = Crisis (instead of Plan B but only if Plan B stops working) - only use your albuterol nebulizer if you first try Plan B and it fails to help > ok to use the nebulizer up to every 4 hours but if start needing it regularly call for immediate appointment    Try albuterol 15 min before an activity (on alternating days with the nebulizer and then nothing)  that you know would make you short of breath and see if it makes any difference and if makes none then don't take albuterol after activity unless you can't catch your breath as this means it's the resting that helps, not the albuterol.  zpak Prednisone 10 mg take  4 each am x 2 days,  2 each am x 2 days,  1 each am x 2 days and stop   Please remember to go to the  x-ray department  for your tests - we will call you with the results when they are available     Please schedule a follow up visit in 6  months but call sooner if needed

## 2020-09-11 ENCOUNTER — Encounter: Payer: Self-pay | Admitting: *Deleted

## 2020-09-12 ENCOUNTER — Encounter: Payer: Self-pay | Admitting: Internal Medicine

## 2020-09-12 NOTE — Assessment & Plan Note (Signed)
Quit smoking 2007 - 02 dep 2015  - alpha one AT screen 06/03/2018  MM level 160 - Spirometry 06/17/2018  FEV1 0.6 (32%)  Ratio 0.50  p trelegy in am/ classic curvature   - 10/07/2018  After extensive coaching inhaler device,  effectiveness =    90% with dpi / 75% hfa   - 01/19/2020  After extensive coaching inhaler device,  effectiveness =    50% (Ti too short)  > try breztri 2 pffs x 15 min p am neb saba > did not time as advised - 03/12/2020  After extensive coaching inhaler device,  effectiveness =    75% rechallnge with breztri x 2 weeks and if not better back to trelegy but either way both are 1st thing in am as she doesn't get sob until starts her am activities  - 09/10/2020  After extensive coaching inhaler device,  effectiveness =    90% with elipta, 75% with hfa  Mild flare typical of Group D and already maint on trelegy so rec  zpak Prednisone 10 mg take  4 each am x 2 days,   2 each am x 2 days,  1 each am x 2 days and stop  Reviewed saba: I spent extra time with pt today reviewing appropriate use of albuterol for prn use on exertion with the following points: 1) saba is for relief of sob that does not improve by walking a slower pace or resting but rather if the pt does not improve after trying this first. 2) If the pt is convinced, as many are, that saba helps recover from activity faster then it's easy to tell if this is the case by re-challenging : ie stop, take the inhaler, then p 5 minutes try the exact same activity (intensity of workload) that just caused the symptoms and see if they are substantially diminished or not after saba 3) if there is an activity that reproducibly causes the symptoms, try the saba 15 min before the activity on alternate days   If in fact the saba really does help, then fine to continue to use it prn but advised may need to look closer at the maintenance regimen being used to achieve better control of airways disease with exertion.

## 2020-09-12 NOTE — Assessment & Plan Note (Signed)
02 dep since 2015   -  03/12/2020 :  Saturations on Room Air at Rest = 85%--increased to 92%5lpm pulsed o2 and walked 1 lap with sats and end 92% 5lpm pulsed.   Again advised:  Make sure you check your oxygen saturation  at your highest level of activity  to be sure it stays over 90% and adjust  02 flow upward to maintain this level if needed but remember to turn it back to previous settings when you stop (to conserve your supply).           Each maintenance medication was reviewed in detail including emphasizing most importantly the difference between maintenance and prns and under what circumstances the prns are to be triggered using an action plan format where appropriate.  Total time for H and P, chart review, counseling, reviewing inhaler/ 02  device(s) and generating customized AVS unique to this office visit / same day charting = 33 min

## 2020-10-03 DIAGNOSIS — E782 Mixed hyperlipidemia: Secondary | ICD-10-CM | POA: Diagnosis not present

## 2020-10-03 DIAGNOSIS — I10 Essential (primary) hypertension: Secondary | ICD-10-CM | POA: Diagnosis not present

## 2020-10-03 DIAGNOSIS — J449 Chronic obstructive pulmonary disease, unspecified: Secondary | ICD-10-CM | POA: Diagnosis not present

## 2020-10-03 DIAGNOSIS — R634 Abnormal weight loss: Secondary | ICD-10-CM | POA: Diagnosis not present

## 2020-10-03 DIAGNOSIS — N342 Other urethritis: Secondary | ICD-10-CM | POA: Diagnosis not present

## 2020-10-03 DIAGNOSIS — F339 Major depressive disorder, recurrent, unspecified: Secondary | ICD-10-CM | POA: Diagnosis not present

## 2020-12-18 ENCOUNTER — Other Ambulatory Visit: Payer: Self-pay | Admitting: Internal Medicine

## 2021-02-11 ENCOUNTER — Telehealth: Payer: Self-pay | Admitting: Internal Medicine

## 2021-02-11 DIAGNOSIS — J449 Chronic obstructive pulmonary disease, unspecified: Secondary | ICD-10-CM

## 2021-02-11 MED ORDER — PREDNISONE 10 MG PO TABS: ORAL_TABLET | ORAL | 0 refills | Status: DC

## 2021-02-11 MED ORDER — AZITHROMYCIN 250 MG PO TABS
ORAL_TABLET | ORAL | 0 refills | Status: DC
Start: 2021-02-11 — End: 2021-03-19

## 2021-02-11 NOTE — Telephone Encounter (Signed)
Zpak Prednisone 10 mg take  4 each am x 2 days,   2 each am x 2 days,  1 each am x 2 days and stop   Be sure to keep f/u appt

## 2021-02-11 NOTE — Telephone Encounter (Signed)
Spoke with the pt and notified of response per MW  She verbalized understanding  Rx was sent to pharm

## 2021-02-11 NOTE — Telephone Encounter (Signed)
I have called the pt and she stated that she had done well all summer and when the weather started to change is when she started having issues. She stated that she thought it was a sinus infection and she has been using the netti pot but she is still having lots of phlegm in her throat and when she can get this out it is tinted yellow.  She denies any other sinus pressure or cough.  She wanted to see if maybe prednisone could be sent in to help this get cleared up .   MW please advise. Thanks  Allergies  Allergen Reactions   Levofloxacin Other (See Comments)    Leg pain and hardness

## 2021-02-11 NOTE — Telephone Encounter (Signed)
Lm x1 for patient.  

## 2021-03-15 ENCOUNTER — Ambulatory Visit: Payer: PPO | Admitting: Internal Medicine

## 2021-03-19 ENCOUNTER — Other Ambulatory Visit: Payer: Self-pay

## 2021-03-19 ENCOUNTER — Encounter: Payer: Self-pay | Admitting: Internal Medicine

## 2021-03-19 ENCOUNTER — Ambulatory Visit: Payer: PPO | Admitting: Internal Medicine

## 2021-03-19 DIAGNOSIS — J449 Chronic obstructive pulmonary disease, unspecified: Secondary | ICD-10-CM | POA: Diagnosis not present

## 2021-03-19 DIAGNOSIS — J9611 Chronic respiratory failure with hypoxia: Secondary | ICD-10-CM

## 2021-03-19 NOTE — Progress Notes (Signed)
Rachel Evans, female    DOB: 13-May-1940     MRN: [id number]   Brief patient profile:  89  yowf  MM/quit smoking 2007 with doe to point where trouble steps but did not req 02 or meds but gradually worse and by  2015 rx= various inhalers and even 02 but didn't use consistently with doe = MMRC2   then acutely worse late Oct 2019 green sputum on trelegy rx levaquin > sore leg tendons so rx augmentin which helped mucus but breathing worse rx nebulizer xopenex then admitted 04/26/18 with "pna/sepsis"@ [facility]  Floor bed > d/c May 03 2018 on IV ancef which was complete Jan 15th 2020 and referred to pulmonary clinic 06/03/2018 by Dr   Gilford Rile.with GOLD III criteria by spirometry 06/17/2018     History of Present Illness  06/03/2018  Pulmonary/ 1st office eval/Rachel Evans  Chief Complaint  Patient presents with   Pulmonary Consult    Referred by Dr. Gilford Rile. Pt states had PNA 04/26/18. She c/o SOB "sometimes"- occ gets winded walking room to room. She has occ cough with clear sputum.   Dyspnea:  MMRC3 = can't walk 100 yards even at a slow pace at a flat grade s stopping due to sob   Cough: clear /no am flares Sleep: on side hob on 1 pillow flat bed  SABA use: not using proair/ has neb with levoalbuterol / back on trelgy now / a bit confused when when / how to use saba  02  None at rest/ 3lpm hs and 3 pulsed  out Typically sats are maint  low 90s at rest RA and upper 80s on POC rec Plan A = Automatic = Trelegy one click daily  Plan B = Backup Only use your albuterol(proair) inhaler as a rescue medication  Work on inhaler technique:  Plan C = Crisis - only use your levoalbuterol nebulizer if you first try Plan B    06/17/2018  f/u ov/Rachel Evans re: GOLD 3 / 02 dep hs and ex on trelegy maint  Chief Complaint  Patient presents with   Follow-up    breathing has been worse over the past 2 days. Her voice has been hoarse but she is not coughing much. She rarely uses her albuterol inhaler or xopenex  neb.   Dyspnea:  Was able to cross parking lot into [employer] s 02 and shopped  Cough: dry raspy daytime aggravated by voice use (on ACEi)  Sleeping: on side flat bed/ one pillow  SABA use: none  02:  Sleeping on 2.5,  At rest 92%  RA and most she does is usually 4lpm rec Adjust your 02 to whatever you need to keep your saturation above 90%  Your lisinopril may cause tickle/ hoarseness/ cough that mimics copd / asthma    10/07/2018  f/u ov/Rachel Evans re: copd GOLD III on trelegy and ACEi  Chief Complaint  Patient presents with   Follow-up    COPD. She reports not having to use her rescue inhaler. She does get DOE.   Dyspnea: doe hc parking/ [employer] slowly = MMRC2 = can't walk a nl pace on a flat grade s sob but does fine slow and flat  Cough: dry daytime assoc with hoarseness  Sleeping: 2 pillow no problme SABA use: much less now  02: 2lpm hs and prn though not   monitoring sats as rec  rec Consider changing zestoretic to any number of suitable alternatives that don't contain ACE inhibitors which  tend to mimic all your freq copd symptoms and make it difficult to sort out where exactly they are coming from     03/11/2019  f/u ov/Rachel Evans re: copd on trelegy daily off acei x 02/16/19  Chief Complaint  Patient presents with   Follow-up    Breathing has improved since the last visit. She rarely uses her albuterol inhaler, but uses her xopenex neb 1-2 x per day.   Dyspnea:  Several  Hundred feet to garden sometime with and sometimes without 02 Cough: sporadic not worse in am / clear mucus - better than it was  Sleeping: fetal bed is flat no resp problems hs  SABA use: never uses pre ex/ changed neb to bid not using hfa prn  But  hfa is poor  02:  2.5 lpm hs /  02 sats at rest typically ok s 02   rec Make sure you check your oxygen saturations at highest level of activity to be sure it stays over 90% and adjust upward to maintain this level  Plan A = Automatic = Always=    Trelegy one click each  am  Plan B = Backup (to supplement plan A, not to replace it) Only use your albuterol inhaler as a rescue medication Plan C = Crisis (instead of Plan B but only if Plan B stops working) - only use your albuterol nebulizer if you first try Plan B and it fails to help > ok to use the nebulizer up to every 4 hours but if start needing it regularly call for immediate appointment Please remember to go to the lab department   for your tests - we will call you with the results when they are available.  Please schedule a follow up visit in 6  months but call sooner if needed Add Na down to 124 so d/c hctz and change micardis to 80 mg (break in half if too strong) and use lasix prn swelling/restrict fluids     09/12/2019  f/u ov/Rachel Evans re:   COPD III / 02 dep lost ground since rehab/ covid shots jan / feb 2021 maint on trelegy / better p pred rx 08/10/19  Dyspnea:  Walking around the house makes her sob   Cough: none Sleeping: flat bed /one pillow, no resp symptoms  SABA use: never prechallenge  02: 3lpm at hs and usually not higher than 4 walking rec Try albuterol (either the puffer or neb)  15 min before   Make sure you check your oxygen saturations at highest level of activity  Prednisone 10 mg take  4 each am x 2 days,   2 each am x 2 days,  1 each am x 2 days and stop      01/19/2020  f/u ov/Rachel Evans re:  Copd III / 02 dep / trelegy maint  Chief Complaint  Patient presents with   Follow-up    COPD, SOB is worse  Dyspnea:  Sob 50 ft  Cough: no  Sleeping: bed flat, big pillow  SABA use: never prechallenges  02: 2-3 lpm and 4-5 with activity  rec For cough > mucinex 1200 mg every 12 hours as needed and use the flutter valve  Plan A = Automatic = Always=    Breztri Take 2 puffs first thing in am and then another 2 puffs about 12 hours later.  Work on inhaler technique:  Prednisone 10 mg Take 4 for three days 3 for three days 2 for three days 1 for three  days and stop  Plan B = Backup (to  supplement plan A, not to replace it) Only use your albuterol inhaler as a rescue medication Plan C = Crisis (instead of Plan B but only if Plan B stops working) - only use your albuterol nebulizer if you first try Plan B and it fails to help > ok to use the nebulizer up to every 4 hours but if start needing it regularly call for immediate appointment   Please schedule a follow up office visit in 4 weeks, sooner if needed     03/12/2020  f/u ov/Rachel Evans re:  COPD GOLD III  Back on trelegy / did not time breztri correctly / doesn't use trelegy first thing in am either. Chief Complaint  Patient presents with   Follow-up    Breathing is slightly improved since the last visit. She has occ cough with clear sputum.    Dyspnea:  50 ft and stops even on 02 up to 5lpm / ? not much better if times walk  p saba but usually walks then uses saba at rest Cough: some sense of pnds x 1 week Sleeping: flat bed big pillow sleeps fine  SABA use: not using as rec prn  02: 3lpm hs / 0 at rest up to 5 lpm walking  rec Prednisone 10 mg take  4 each am x 2 days,   2 each am x 2 days,  1 each am x 2 days and stop  If sense of postnasal of drainage doesn't get better it probably is the trelegy causing the problem Plan A = Automatic = Always=    Trelegy one click each am  (or Breztri  Take 2 puffs first thing in am and then another 2 puffs about 12 hours later)   Work on inhaler technique:   Plan B = Backup (to supplement plan A, not to replace it) Only use your albuterol inhaler as a rescue medication to be used if you can't catch your breath by resting or doing a relaxed purse lip breathing pattern.  - The less you use it, the better it will work when you need it. - Ok to use the inhaler up to 2 puffs  every 4 hours if you must but call for appointment if use goes up over your usual need - Don't leave home without it !!  (think of it like the spare tire for your car)   Plan C = Crisis (instead of Plan B but only if  Plan B stops working) - only use your albuterol nebulizer if you first try Plan B  Try albuterol 15 min before an activity (alternating days)  that you know would make you short of breath        09/10/2020  f/u ov/Rachel Evans re:  GOLD III but 02 dep COPD? maint on trelegy  Chief Complaint  Patient presents with   Follow-up    Breathing has been worse over the past 6 wks. She has been coughing up some light brown sputum for the past month. She states that she gets SOB walking 50 ft. She rarely uses her albuterol inhaler or neb.   Dyspnea:  50 ft does not check sats / worse vs baaseline x 6 weeks assoc with worse cough/ congetion Cough: as above, turned light brown  Sleeping: flat bed / thick pillow does ok SABA use: not using as much lately 02: 3.5 sleeping/ up 4-6lpm when walking  Covid status:   vax x 3 Rec  Plan A = Automatic = Always=    trelegy one click first thing in MA  Plan B = Backup (to supplement plan A, not to replace it) Only use your albuterol inhaler as a rescue medication Plan C = Crisis (instead of Plan B but only if Plan B stops working) - only use your albuterol nebulizer if you first try Plan B and it fails to help  Try albuterol 15 min before an activity (on alternating days with the nebulizer and then nothing)  that you know would make you short of breath   03/19/2021  f/u ov/Rachel Evans re: GOLD 3/ 02 dep  maint on trelegy 100  Chief Complaint  Patient presents with   Follow-up    Breathing is about the same since the last visit. Her cough has improved some.  She rarely uses her albuterol inhaler or neb.   Dyspnea:  walks at [employer] no trouble pushing cart on 3lpm not checking  Cough: none  Sleeping: flat bed thick pillow ok  SABA use: rarely  02: 3 sleeping / up to 6lpm  Covid status:   vax x 5    No obvious day to day or daytime variability or assoc excess/ purulent sputum or mucus plugs or hemoptysis or cp or chest tightness, subjective wheeze or overt sinus or hb  symptoms.   Sleeping  without nocturnal  or early am exacerbation  of respiratory  c/o's or need for noct saba. Also denies any obvious fluctuation of symptoms with weather or environmental changes or other aggravating or alleviating factors except as outlined above   No unusual exposure hx or h/o childhood pna/ asthma or knowledge of premature birth.  Current Allergies, Complete Past Medical History, Past Surgical History, Family History, and Social History were reviewed in [employer] record.  ROS  The following are not active complaints unless bolded Hoarseness, sore throat, dysphagia, dental problems, itching, sneezing,  nasal congestion or discharge of excess mucus or purulent secretions, ear ache,   fever, chills, sweats, unintended wt loss or wt gain, classically pleuritic or exertional cp,  orthopnea pnd or arm/hand swelling  or leg swelling, presyncope, palpitations, abdominal pain, anorexia, nausea, vomiting, diarrhea  or change in bowel habits or change in bladder habits, change in stools or change in urine, dysuria, hematuria,  rash, arthralgias, visual complaints, headache, numbness, weakness or ataxia or problems with walking or coordination,  change in mood or  memory.        Current Meds  Medication Sig   albuterol (PROAIR HFA) 108 (90 Base) MCG/ACT inhaler Up to 2 puffs every 4 hours if needed   albuterol (PROVENTIL) (2.5 MG/3ML) 0.083% nebulizer solution Take 1.5 mLs (1.25 mg total) by nebulization every 6 (six) hours as needed for wheezing or shortness of breath. Use only half of a vial.   aspirin EC 81 MG tablet Take 81 mg by mouth daily.    b complex vitamins capsule Take 1 capsule by mouth daily.    Biotin 10000 MCG TABS Take 1 capsule by mouth daily.   CALCIUM PO Take 1 tablet by mouth daily.   Cholecalciferol 25 MCG (1000 UT) tablet Take 1,000 Units by mouth daily.    fluticasone (FLONASE) 50 MCG/ACT nasal spray Place 1 spray into both nostrils  daily as needed.    furosemide (LASIX) 20 MG tablet Take 10 mg by mouth daily as needed.   Omega-3 1000 MG CAPS Take 1 capsule by mouth 2 (two) times daily.  OXYGEN 3lpm with sleep and then as needed during the day  [facility]   pravastatin (PRAVACHOL) 20 MG tablet Take 20 mg by mouth daily.   sertraline (ZOLOFT) 50 MG tablet Take 50 mg by mouth daily.    telmisartan (MICARDIS) 80 MG tablet Take 1 tablet (80 mg total) by mouth daily.   TRELEGY ELLIPTA 100-62.5-25 MCG/INH AEPB INHALE ONE PUFF INTO THE LUNGS DAILY   TURMERIC PO Take 1 capsule by mouth daily.           Objective:      03/19/2021    96   09/10/2020         93 03/12/2020        97  01/19/2020        96 09/12/2019        98 03/11/2019        99 10/07/2018          97   06/17/18 95 lb 12.8 oz (43.5 kg)  06/03/18 96 lb 9.6 oz (43.8 kg)  05/27/18 98 lb 9.6 oz (44.7 kg)    Vital signs reviewed  03/19/2021  - Note at rest 02 sats  98% on 4lpulsed    General appearance:    pleasant amb thin wf nad   HEENT : pt wearing mask not removed for exam due to covid -19 concerns.    NECK :  without JVD/Nodes/TM/ nl carotid upstrokes bilaterally   LUNGS: no acc muscle use,  Mod barrel  contour chest wall with bilateral  Distant bs s audible wheeze and  without cough on insp or exp maneuvers and mod  Hyperresonant  to  percussion bilaterally     CV:  RRR  no s3 or murmur or increase in P2, and no edema   ABD:  soft and nontender with pos mid insp Hoover's  in the supine position. No bruits or organomegaly appreciated, bowel sounds nl  MS:     ext warm without deformities, calf tenderness, cyanosis or clubbing No obvious joint restrictions   SKIN: warm and dry without lesions    NEURO:  alert, approp, nl sensorium with  no motor or cerebellar deficits apparent.           Assessment

## 2021-03-19 NOTE — Assessment & Plan Note (Signed)
02 dep since 2015   -  03/12/2020 :  Saturations on Room Air at Rest = 85%--increased to 92%5lpm pulsed o2 and walked 1 lap with sats and end 92% 5lpm pulsed.    Advised: Make sure you check your oxygen saturation  AT  your highest level of activity (not after you stop)   to be sure it stays over 90% and adjust  02 flow upward to maintain this level if needed but remember to turn it back to previous settings when you stop (to conserve your supply).          Each maintenance medication was reviewed in detail including emphasizing most importantly the difference between maintenance and prns and under what circumstances the prns are to be triggered using an action plan format where appropriate.  Total time for H and P, chart review, counseling, reviewing elipta, hfa/02 device(s) and generating customized AVS unique to this office visit / same day charting = 21 min

## 2021-03-19 NOTE — Assessment & Plan Note (Signed)
Quit smoking 2007 - 02 dep 2015  - alpha one AT screen 06/03/2018  MM level 160 - Spirometry 06/17/2018  FEV1 0.6 (32%)  Ratio 0.50  p trelegy in am/ classic curvature   - 10/07/2018  After extensive coaching inhaler device,  effectiveness =    90% with dpi / 75% hfa   - 01/19/2020  After extensive coaching inhaler device,  effectiveness =    50% (Ti too short)  > try breztri 2 pffs x 15 min p am neb saba > did not time as advised - 03/12/2020  After extensive coaching inhaler device,  effectiveness =    75% rechallnge with breztri x 2 weeks and if not better back to trelegy but either way both are 1st thing in am as she doesn't get sob until starts her am activities  - 09/10/2020  After extensive coaching inhaler device,  effectiveness =    90% with elipta, 75% with hfa   Group D in terms of symptom/risk and laba/lama/ICS  therefore appropriate rx at this point >>>  trelegy and prn saba   Re saba: Re SABA :  I spent extra time with pt today reviewing appropriate use of albuterol for prn use on exertion with the following points: 1) saba is for relief of sob that does not improve by walking a slower pace or resting but rather if the pt does not improve after trying this first. 2) If the pt is convinced, as many are, that saba helps recover from activity faster then it's easy to tell if this is the case by re-challenging : ie stop, take the inhaler, then p 5 minutes try the exact same activity (intensity of workload) that just caused the symptoms and see if they are substantially diminished or not after saba 3) if there is an activity that reproducibly causes the symptoms, try the saba 15 min before the activity on alternate days   If in fact the saba really does help, then fine to continue to use it prn but advised may need to look closer at the maintenance regimen being used to achieve better control of airways disease with exertion.

## 2021-03-19 NOTE — Patient Instructions (Signed)
No change in medications    Please schedule a follow up visit in 6  months but call sooner if needed  

## 2021-05-13 ENCOUNTER — Telehealth: Payer: Self-pay | Admitting: Internal Medicine

## 2021-05-13 MED ORDER — AZITHROMYCIN 250 MG PO TABS: ORAL_TABLET | ORAL | 0 refills | Status: DC

## 2021-05-13 MED ORDER — PREDNISONE 10 MG PO TABS: ORAL_TABLET | ORAL | 0 refills | Status: AC

## 2021-05-13 NOTE — Telephone Encounter (Signed)
Call returned to patient, made aware of recommendations. Voiced understanding. Patient has an appt with her PCP tomorrow. Video visit made with NP on Wednesday. Confirmed pharmacy. Medication sent in.   Nothing further needed at this time.

## 2021-05-13 NOTE — Telephone Encounter (Signed)
Would prefer she see one of our NP's but if nothing available then zpak/ Prednisone 10 mg take  4 each am x 2 days,   2 each am x 2 days,  1 each am x 2 days and stop

## 2021-05-13 NOTE — Telephone Encounter (Signed)
Call made to patient, confirmed DOB. Patient reports developing a dry cough a few days but is now having a productive cough with thick brown mucous. She states this happens about every 3 months during the winter time. Denies fever, chills, sweats, or body aches. Using Trelegy daily, rarely uses Albuterol inhaler and does not use nebulizer unless a last resort. Reports her breathing has become more labored and would like something called in. She is using 3-5L pulsed. She sleeps with 3 and 1/2 liters at night. She reports her oxygen is dropping into the high 80's with exertion. 93% at time of phone call.   MW please advise. Thanks

## 2021-05-15 ENCOUNTER — Other Ambulatory Visit: Payer: Self-pay

## 2021-05-15 ENCOUNTER — Telehealth (INDEPENDENT_AMBULATORY_CARE_PROVIDER_SITE_OTHER): Payer: PPO | Admitting: Primary Care

## 2021-05-15 DIAGNOSIS — J441 Chronic obstructive pulmonary disease with (acute) exacerbation: Secondary | ICD-10-CM | POA: Diagnosis not present

## 2021-05-15 MED ORDER — ALBUTEROL SULFATE HFA 108 (90 BASE) MCG/ACT IN AERS: INHALATION_SPRAY | RESPIRATORY_TRACT | 11 refills | Status: DC

## 2021-05-15 NOTE — Progress Notes (Signed)
Virtual Visit via Video Note  I connected with Rachel Evans on 05/15/21 at 12:00 PM EST by a video enabled telemedicine application and verified that I am speaking with the correct person using two identifiers.  Location: Patient: Home Provider: Office    I discussed the limitations of evaluation and management by telemedicine and the availability of in person appointments. The patient expressed understanding and agreed to proceed.  History of Present Illness: PMH significant for severe COPD, HTN, hyperlipidemia, arthritis. Patient of Dr. Melvyn Novas.   05/15/2021 Patient contacted today for follow-up sick visit. She developed dry cough four days ago. Cough soon became productive with dark brown mucus. She was sent in [place] and prednisone taper for bronchitis symptoms on 05/13/21 but has not started medication yet since her cough did improve. She has not coughed up any mucus for the last two days. She is using Trelegy 178mcg daily as prescribed. She has not used albuterol inhaler or nebulizer, needs refill. She is chronically on 3-5L pulsed oxygen. Her O2 will temporarily drop into high 80s with any exertion which she reports is not new for her. She had annual wellness exam with her PCP yesterday. On arrival her SpO2 was 84% but recovered to 99% on 4L quickly with rest. Lungs were decreased no wheezing, rhonchi or rales.   Observations/Objective:  - Appears well on video. Non-toxic. Alert and oriented x 3. No visible respiratory distress or overt sob, wheezing or cough.  Assessment and Plan:  COPD exacerbation: - Patient developed cough 4 days ago. Cough is intermittently productive with purulent mucus. She appears stable during virtual visit today. No visible respiratory distress. Recommend she start azithromycin and prednisone taper as prescribed by Dr. Melvyn Novas as she has a high risk for decompensation d/t underlying severe COPD. Continue Trelegy 160mcg one puff daily. Sending in refill albuterol hfa.    Chronic respiratory failure: - Maintained on 3-5L supplemental oxygen with exertion and at night. Patient temporarily desaturates mid-high 80s with ambulation but reportedly recovers quickly with rest. Advised she use continuous oxygen when COPD symptoms are acutely flared as opposed to pulsed.   Follow Up Instructions:  Call office if symptoms fail to improve or wornse    I discussed the assessment and treatment plan with the patient. The patient was provided an opportunity to ask questions and all were answered. The patient agreed with the plan and demonstrated an understanding of the instructions.   The patient was advised to call back or seek an in-person evaluation if the symptoms worsen or if the condition fails to improve as anticipated.  I provided 22 minutes of non-face-to-face time during this encounter.   Martyn Ehrich, NP

## 2021-07-31 IMAGING — DX DG CHEST 2V
2 series · 2 of 2 positions shown · non-contrast
Comparison: 06/17/2018

CLINICAL DATA: COPD exacerbation.  History of hypertension.

EXAM:
CHEST - 2 VIEW

[chest pa]
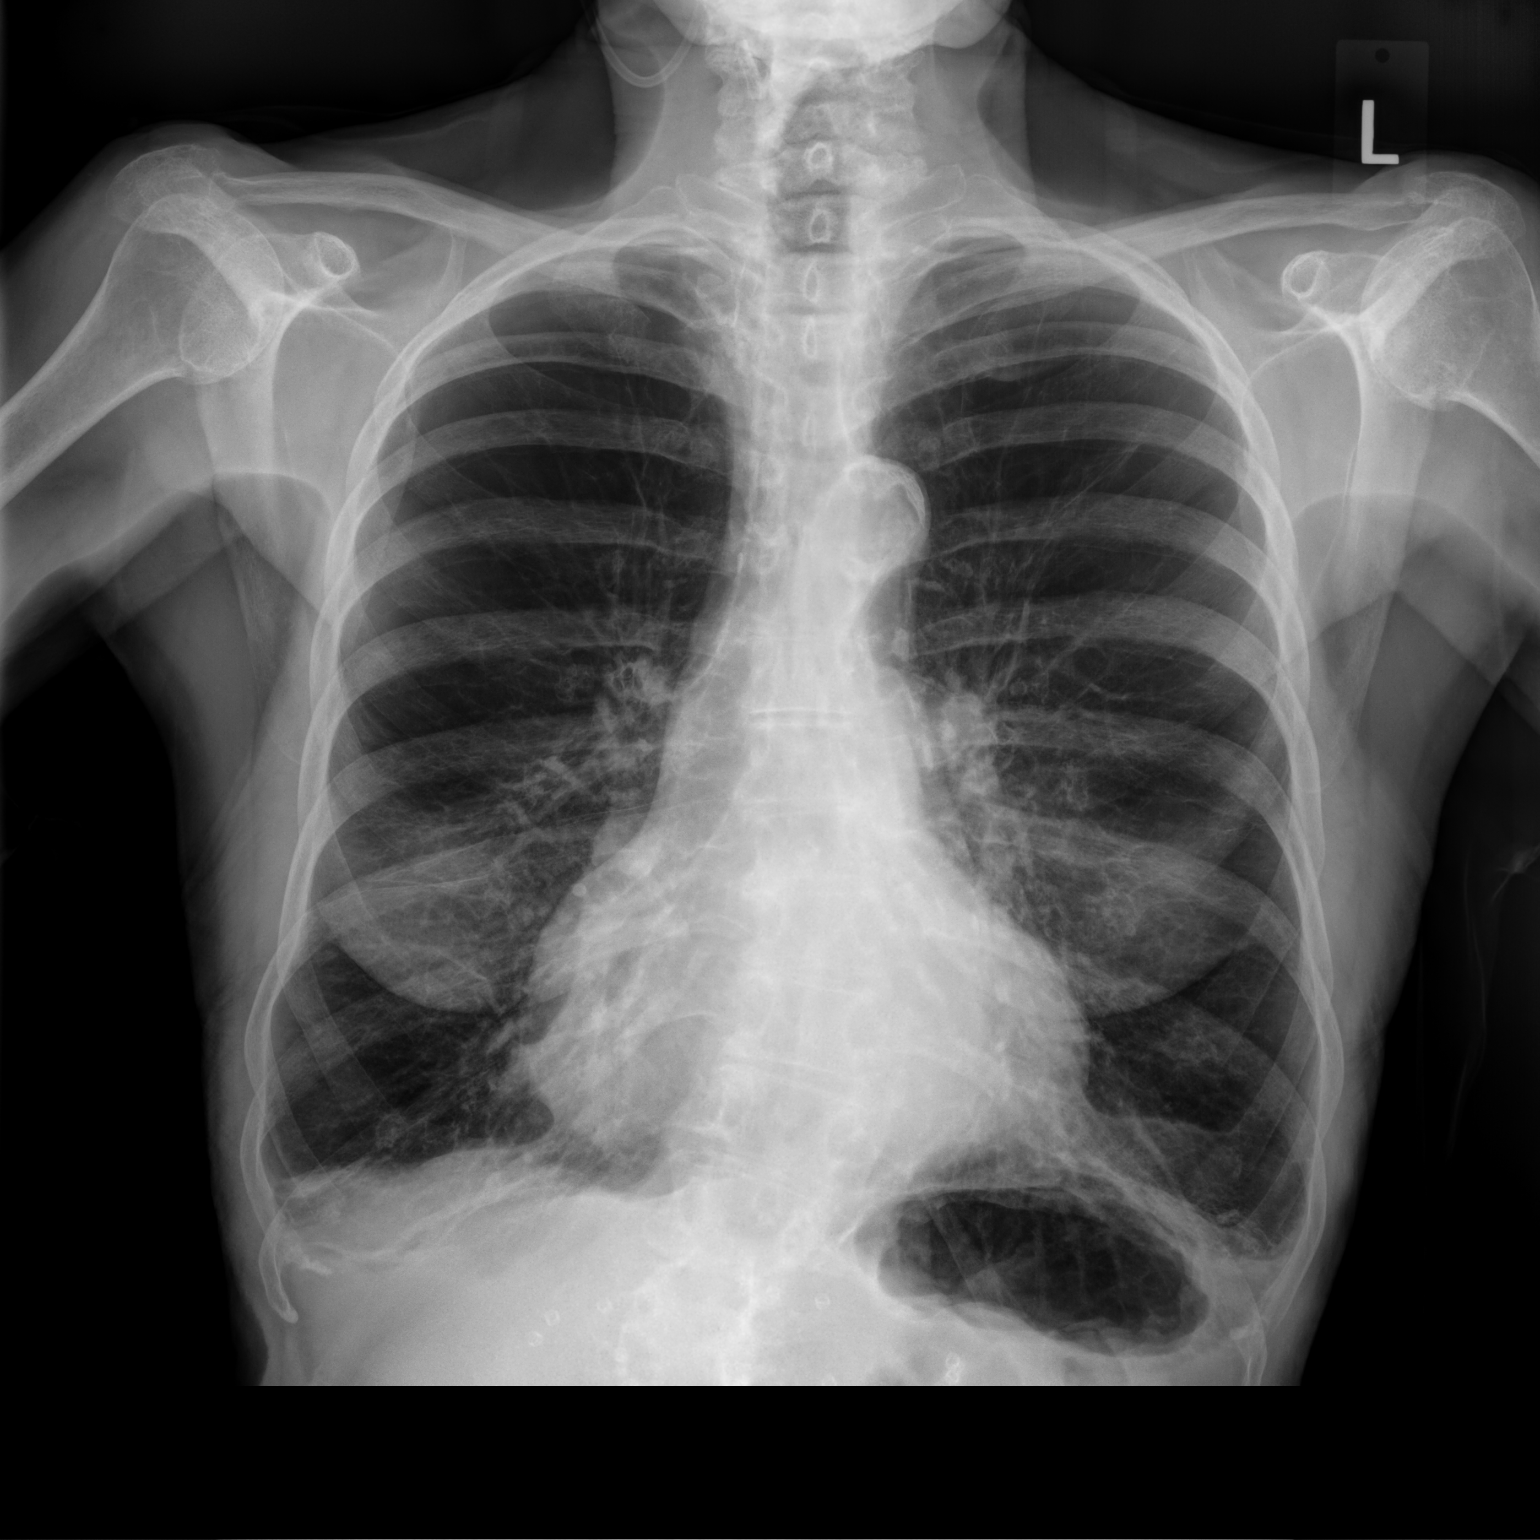

[chest lat]
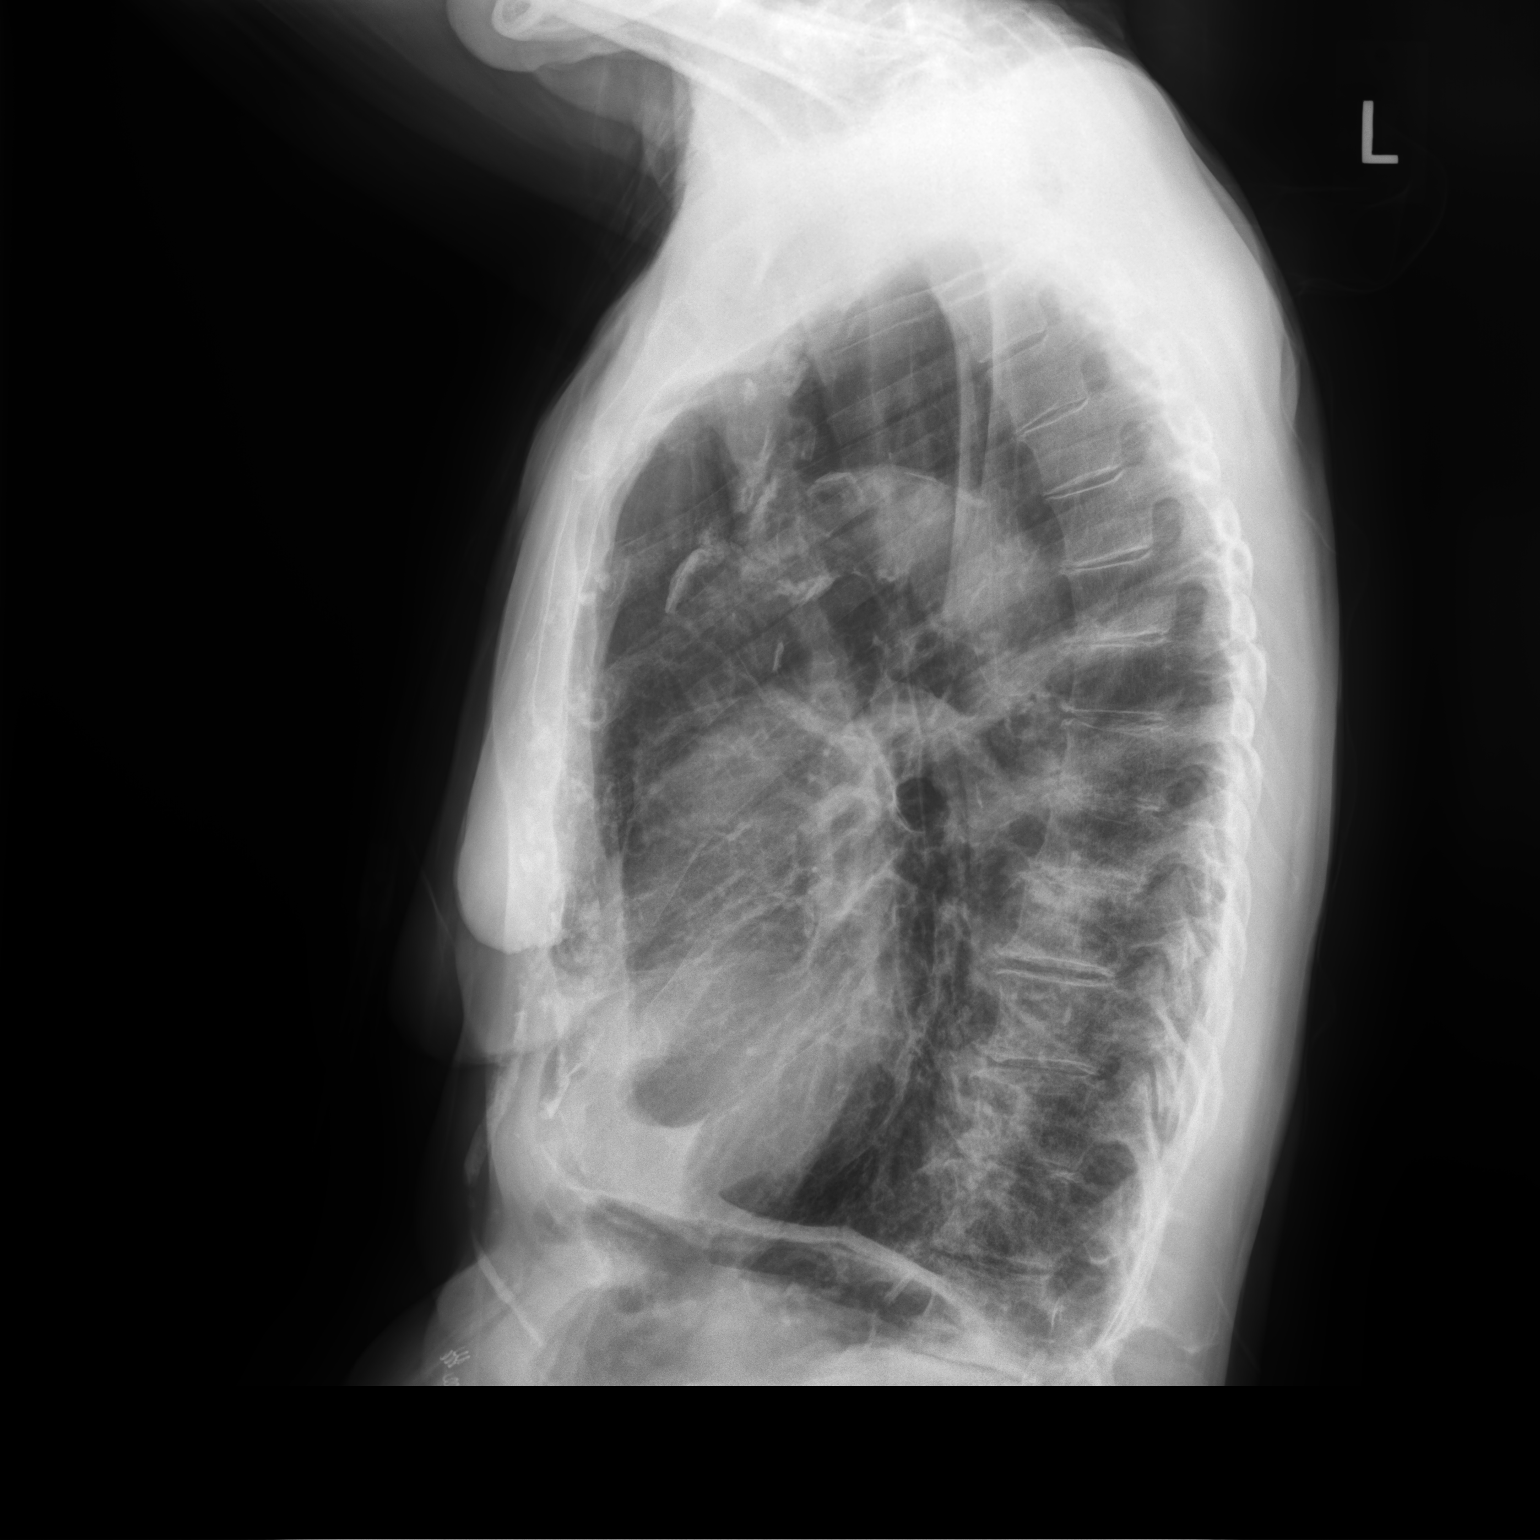

[2 of 2 positions shown; findings below may reference images not displayed]

FINDINGS: Cardiac silhouette is normal in size. No mediastinal or hilar
masses. No evidence of adenopathy. Aortic atherosclerosis.

Lungs are hyperexpanded. Linear scarring in the bases. No evidence
of pneumonia or pulmonary edema.

No pleural effusion or pneumothorax.

Skeletal structures are intact.
IMPRESSION: 1. No acute cardiopulmonary disease.
2. COPD with advanced emphysema.

## 2021-09-16 ENCOUNTER — Ambulatory Visit: Payer: PPO | Admitting: Internal Medicine

## 2021-09-23 ENCOUNTER — Encounter: Payer: Self-pay | Admitting: Internal Medicine

## 2021-09-23 ENCOUNTER — Ambulatory Visit: Payer: PPO | Admitting: Internal Medicine

## 2021-09-23 DIAGNOSIS — J9611 Chronic respiratory failure with hypoxia: Secondary | ICD-10-CM | POA: Diagnosis not present

## 2021-09-23 DIAGNOSIS — J449 Chronic obstructive pulmonary disease, unspecified: Secondary | ICD-10-CM | POA: Diagnosis not present

## 2021-09-23 NOTE — Assessment & Plan Note (Signed)
Quit smoking 2007 - 02 dep 2015  - alpha one AT screen 06/03/2018  MM level 160 - Spirometry 06/17/2018  FEV1 0.6 (32%)  Ratio 0.50  p trelegy in am/ classic curvature   - 10/07/2018  After extensive coaching inhaler device,  effectiveness =    90% with dpi / 75% hfa   - 01/19/2020  After extensive coaching inhaler device,  effectiveness =    50% (Ti too short)  > try breztri 2 pffs x 15 min p am neb saba > did not time as advised - 03/12/2020  After extensive coaching inhaler device,  effectiveness =    75% rechallnge with breztri x 2 weeks and if not better back to trelegy but either way both are 1st thing in am as she doesn't get sob until starts her am activities  - 09/10/2020  After extensive coaching inhaler device,  effectiveness =    90% with elipta    Group D (now reclassified as E) in terms of symptom/risk and laba/lama/ICS  therefore appropriate rx at this point >>>  trelegy and approp saba

## 2021-09-23 NOTE — Progress Notes (Signed)
Rachel Evans, female    DOB: May 13, 1940     MRN: [id number]   Brief patient profile:  86  yowf  MM/quit smoking 2007 with doe to point where trouble steps but did not req 02 or meds but gradually worse and by  2015 rx= various inhalers and even 02 but didn't use consistently with doe = MMRC2   then acutely worse late Oct 2019 green sputum on trelegy rx levaquin > sore leg tendons so rx augmentin which helped mucus but breathing worse rx nebulizer xopenex then admitted 04/26/18 with "pna/sepsis"@ [facility]  Floor bed > d/c May 03 2018 on IV ancef which was complete Jan 15th 2020 and referred to pulmonary clinic 06/03/2018 by Dr   Gilford Rile.with GOLD III criteria by spirometry 06/17/2018     History of Present Illness  06/03/2018  Pulmonary/ 1st office eval/Rachel Evans  Chief Complaint  Patient presents with   Pulmonary Consult    Referred by Dr. Gilford Rile. Pt states had PNA 04/26/18. She c/o SOB "sometimes"- occ gets winded walking room to room. She has occ cough with clear sputum.   Dyspnea:  MMRC3 = can't walk 100 yards even at a slow pace at a flat grade s stopping due to sob   Cough: clear /no am flares Sleep: on side hob on 1 pillow flat bed  SABA use: not using proair/ has neb with levoalbuterol / back on trelgy now / a bit confused when when / how to use saba  02  None at rest/ 3lpm hs and 3 pulsed  out Typically sats are maint  low 90s at rest RA and upper 80s on POC rec Plan A = Automatic = Trelegy one click daily  Plan B = Backup Only use your albuterol(proair) inhaler as a rescue medication  Work on inhaler technique:  Plan C = Crisis - only use your levoalbuterol nebulizer if you first try Plan B       01/19/2020  f/u ov/Rachel Evans re:  Copd III / 02 dep / trelegy maint  Chief Complaint  Patient presents with   Follow-up    COPD, SOB is worse  Dyspnea:  Sob 50 ft  Cough: no  Sleeping: bed flat, big pillow  SABA use: never prechallenges  02: 2-3 lpm and 4-5 with activity   rec For cough > mucinex 1200 mg every 12 hours as needed and use the flutter valve  Plan A = Automatic = Always=    Breztri Take 2 puffs first thing in am and then another 2 puffs about 12 hours later.  Work on inhaler technique:  Prednisone 10 mg Take 4 for three days 3 for three days 2 for three days 1 for three days and stop  Plan B = Backup (to supplement plan A, not to replace it) Only use your albuterol inhaler as a rescue medication Plan C = Crisis (instead of Plan B but only if Plan B stops working) - only use your albuterol nebulizer if you first try Plan B and it fails to help > ok to use the nebulizer up to every 4 hours but if start needing it regularly call for immediate appointment           09/23/2021  f/u ov/Rachel Evans re: GOLD 3/ 02 dep maint on trelegy   Chief Complaint  Patient presents with   Follow-up    Breathing is about the same since her last visit here. She rarely uses albuterol inhaler or neb.  Dyspnea:  can do [employer] in [place] stops p 3 aisles s checking 02  Cough: none  Sleeping: falt bed one big pillow SABA use: none - didn't seem to help much used pre-ex  02: 3-3.5 hs and up 4-5 lpm with exertion but really not titrating Covid status:   "all of 'em"   No obvious day to day or daytime variability or assoc excess/ purulent sputum or mucus plugs or hemoptysis or cp or chest tightness, subjective wheeze or overt sinus or hb symptoms.   Sleeping  without nocturnal  or early am exacerbation  of respiratory  c/o's or need for noct saba. Also denies any obvious fluctuation of symptoms with weather or environmental changes or other aggravating or alleviating factors except as outlined above   No unusual exposure hx or h/o childhood pna/ asthma or knowledge of premature birth.  Current Allergies, Complete Past Medical History, Past Surgical History, Family History, and Social History were reviewed in [employer] record.  ROS  The  following are not active complaints unless bolded Hoarseness, sore throat, dysphagia, dental problems, itching, sneezing,  nasal congestion or discharge of excess mucus or purulent secretions, ear ache,   fever, chills, sweats, unintended wt loss or wt gain, classically pleuritic or exertional cp,  orthopnea pnd or arm/hand swelling  or leg swelling, presyncope, palpitations, abdominal pain, anorexia, nausea, vomiting, diarrhea  or change in bowel habits or change in bladder habits, change in stools or change in urine, dysuria, hematuria,  rash, arthralgias, visual complaints, headache, numbness, weakness or ataxia or problems with walking or coordination,  change in mood or  memory.        Current Meds  Medication Sig   albuterol (PROAIR HFA) 108 (90 Base) MCG/ACT inhaler Up to 2 puffs every 4 hours if needed   albuterol (PROVENTIL) (2.5 MG/3ML) 0.083% nebulizer solution Take 1.5 mLs (1.25 mg total) by nebulization every 6 (six) hours as needed for wheezing or shortness of breath. Use only half of a vial.   aspirin EC 81 MG tablet Take 81 mg by mouth daily.    b complex vitamins capsule Take 1 capsule by mouth daily.    Biotin 10000 MCG TABS Take 1 capsule by mouth daily.   CALCIUM PO Take 1 tablet by mouth daily.   Cholecalciferol 25 MCG (1000 UT) tablet Take 1,000 Units by mouth daily.    fluticasone (FLONASE) 50 MCG/ACT nasal spray Place 1 spray into both nostrils daily as needed.    furosemide (LASIX) 20 MG tablet Take 10 mg by mouth daily as needed.   Omega-3 1000 MG CAPS Take 1 capsule by mouth 2 (two) times daily.    OXYGEN 3lpm with sleep and then as needed during the day  [facility]   pravastatin (PRAVACHOL) 20 MG tablet Take 20 mg by mouth daily.   sertraline (ZOLOFT) 50 MG tablet Take 50 mg by mouth daily.    telmisartan (MICARDIS) 80 MG tablet Take 1 tablet (80 mg total) by mouth daily.   TRELEGY ELLIPTA 100-62.5-25 MCG/INH AEPB INHALE ONE PUFF INTO THE LUNGS DAILY   TURMERIC PO  Take 1 capsule by mouth daily.           Objective:    Wts   09/23/2021        96 03/19/2021      96   09/10/2020         93 03/12/2020        97  01/19/2020  96 09/12/2019        98 03/11/2019        99 10/07/2018          97   06/17/18 95 lb 12.8 oz (43.5 kg)  06/03/18 96 lb 9.6 oz (43.8 kg)  05/27/18 98 lb 9.6 oz (44.7 kg)    Vital signs reviewed  09/23/2021  - Note at rest 02 sats  97% on 4lpm POC    General appearance:   amb elderly wf nad    HEENT :  Oropharynx  clear   Nasal turbintes nl    NECK :  without JVD/Nodes/TM/ nl carotid upstrokes bilaterally   LUNGS: no acc muscle use,  Mod barrel  contour chest wall with bilateral  Distant bs s audible wheeze and  without cough on insp or exp maneuvers and mod  Hyperresonant  to  percussion bilaterally     CV:  RRR  no s3 or murmur or increase in P2, and no edema   ABD:  soft and nontender with pos mid insp Hoover's  in the supine position. No bruits or organomegaly appreciated, bowel sounds nl  MS:   Ext warm without deformities or   obvious joint restrictions , calf tenderness, cyanosis or clubbing  SKIN: warm and dry without lesions    NEURO:  alert, approp, nl sensorium with  no motor or cerebellar deficits apparent.                  Assessment

## 2021-09-23 NOTE — Patient Instructions (Signed)
No change in medications  Make sure you check your oxygen saturation  AT  your highest level of activity (not after you stop)   to be sure it stays over 90% and adjust  02 flow upward to maintain this level if needed but remember to turn it back to previous settings when you stop (to conserve your supply).   Please schedule a follow up visit in 12 months but call sooner if needed

## 2021-09-23 NOTE — Assessment & Plan Note (Addendum)
02 dep since 2015   -  03/12/2020 :  Saturations on Room Air at Rest = 85%--increased to 92%5lpm pulsed o2 and walked 1 lap with sats and end 92% 5lpm pulsed.   Again advise: Make sure you check your oxygen saturation  AT  your highest level of activity (not after you stop)   to be sure it stays over 90% and adjust  02 flow upward to maintain this level if needed but remember to turn it back to previous settings when you stop (to conserve your supply).   F/u yearly          Each maintenance medication was reviewed in detail including emphasizing most importantly the difference between maintenance and prns and under what circumstances the prns are to be triggered using an action plan format where appropriate.  Total time for H and P, chart review, counseling, reviewing dpi/hfa/02 device(s) and generating customized AVS unique to this office visit / same day charting = 24 min

## 2021-11-21 ENCOUNTER — Telehealth: Payer: Self-pay | Admitting: Internal Medicine

## 2021-11-21 MED ORDER — FLUTICASONE-UMECLIDIN-VILANT 100-62.5-25 MCG/ACT IN AEPB: INHALATION_SPRAY | RESPIRATORY_TRACT | 6 refills | Status: DC

## 2021-11-21 NOTE — Telephone Encounter (Signed)
I called to let the patient know that a refill was sent to the pharmacy. Nothing further needed.

## 2021-11-29 ENCOUNTER — Telehealth: Payer: Self-pay | Admitting: Internal Medicine

## 2021-11-29 DIAGNOSIS — J9611 Chronic respiratory failure with hypoxia: Secondary | ICD-10-CM

## 2021-11-29 NOTE — Telephone Encounter (Signed)
Called patient this morning and she states that she needs the order for her POC machine. She states that she is buying a new POC off [employer] and that the company is needing the order.   Sir are you ok with [place] writing the order for her POC machine. She states that she is on 4L pulsed with exertion and 2-3L when she is at rest.   Please advise sir

## 2021-11-29 NOTE — Telephone Encounter (Signed)
Called and spoke with patient. She is aware that MW is ok with the order. The order will need to be faxed to [phone].   Order has been placed.   Nothing further needed at time of call.

## 2021-11-29 NOTE — Telephone Encounter (Signed)
Ok with me to write order

## 2022-04-21 ENCOUNTER — Telehealth: Payer: Self-pay | Admitting: Internal Medicine

## 2022-04-21 MED ORDER — PREDNISONE 10 MG PO TABS: ORAL_TABLET | ORAL | 0 refills | Status: DC

## 2022-04-21 NOTE — Telephone Encounter (Signed)
Spoke with pt and reviewed Dr. Gustavus Bryant recommendations along with prednisone instructions. Scheduled pt for OV with Dr. Melvyn Novas on 05/26/22. Pt stated understanding and verified pharmacy as [facility] in [place]. Prednisone order placed. Nothing further needed at this time.

## 2022-04-21 NOTE — Telephone Encounter (Signed)
Prednisone 10 mg take  4 each am x 2 days,   2 each am x 2 days,  1 each am x 2 days and stop and f/u ov w/in 4 weeks - call for sooner appt it not improving by end of week

## 2022-04-21 NOTE — Telephone Encounter (Signed)
Pt calling for a pred script. Hard time breathing.  Call in to [facility] in [place].   Her # [phone]

## 2022-05-02 ENCOUNTER — Telehealth: Payer: Self-pay | Admitting: Internal Medicine

## 2022-05-02 NOTE — Telephone Encounter (Signed)
Patient called to ask if she could be worked in for an appt. Earlier than her 01/22 appt.  Checked the schedules both for doctors and Nps and nothing was available before that time.  Informed the patient and patient said she would still like to know if she can be worked in sooner.  Please call patient to discuss further.  CB# [phone]

## 2022-05-02 NOTE — Telephone Encounter (Signed)
Called and left voicemail for patient to call office back. I told her we have some openings before her 05/26/22 appointment in the [place] office if she wishes to be seen here.

## 2022-05-06 DIAGNOSIS — I340 Nonrheumatic mitral (valve) insufficiency: Secondary | ICD-10-CM | POA: Diagnosis not present

## 2022-05-19 ENCOUNTER — Ambulatory Visit (INDEPENDENT_AMBULATORY_CARE_PROVIDER_SITE_OTHER): Payer: PPO | Admitting: Adult Health

## 2022-05-19 ENCOUNTER — Encounter: Payer: Self-pay | Admitting: Adult Health

## 2022-05-19 VITALS — BP 118/80 | HR 65 | Temp 98.2°F | Ht 60.0 in | Wt 88.8 lb

## 2022-05-19 DIAGNOSIS — J189 Pneumonia, unspecified organism: Secondary | ICD-10-CM | POA: Diagnosis not present

## 2022-05-19 DIAGNOSIS — J441 Chronic obstructive pulmonary disease with (acute) exacerbation: Secondary | ICD-10-CM

## 2022-05-19 DIAGNOSIS — J9611 Chronic respiratory failure with hypoxia: Secondary | ICD-10-CM

## 2022-05-19 NOTE — Patient Instructions (Addendum)
Continue on Trelegy 1 puff daily , rinse after use.  Albuterol inhaler or neb As needed   Continue on Oxygen 3l/m at rest and 5l/m with activity   I recommend RSV vaccine.  High protein diet.  Activity as tolerated.  Follow up with Dr. Melvyn Novas  in 6-8 weeks and As needed  , with chest xray  Please contact office for sooner follow up if symptoms do not improve or worsen or seek emergency care

## 2022-05-19 NOTE — Assessment & Plan Note (Addendum)
Right-sided opacities consistent with pneumonia.  Check chest x-ray on return.  Clinically has improved with antibiotic therapy.  Plan  Patient Instructions  Continue on Trelegy 1 puff daily , rinse after use.  Albuterol inhaler or neb As needed   Continue on Oxygen 3l/m at rest and 5l/m with activity   I recommend RSV vaccine.  High protein diet.  Activity as tolerated.  Follow up with Dr. Melvyn Novas  in 6-8 weeks and As needed  , with chest xray  Please contact office for sooner follow up if symptoms do not improve or worsen or seek emergency care

## 2022-05-19 NOTE — Progress Notes (Signed)
$'@Patient'M$  ID: Rachel Evans, female    DOB: 05/04/1941, 82 y.o.   MRN: [id number]  Chief Complaint  Patient presents with   Hospitalization Follow-up    Referring provider: Raina Mina., MD  HPI: 82 year old female former smoker followed for COPD and chronic respiratory failure  TEST/EVENTS :  02 dep 2015  - alpha one AT screen 06/03/2018  MM level 160 - Spirometry 06/17/2018  FEV1 0.6 (32%)  Ratio 0.50   05/19/2022 Follow up: COPD, O2 RF , Post hospital follow up  Patient presents for a posthospital follow-up.  Patient was hospitalized earlier this month for COPD exacerbation.  Hospital records shows CT chest was negative for PE.  Showed advanced emphysema and a streaky right middle lobe and lower lobe densities consistent with pneumonia.  She was treated with antibiotics, nebulized bronchodilators and steroids.  Patient has underlying severe COPD and chronic respiratory failure on home oxygen.  Remains on Trelegy inhaler daily.  Has albuterol inhaler and nebulizer.  Since discharge patient is feeling better with decreased cough and congestion. Patient still remains independent.  She works part-time with personal [employer].  And is able to drive short distances.  She can do light activities but gets short of breath with heavy chores. Weight continues to slowly go down.  Currently weighs 88 pounds.  We discussed a high-protein diet  Allergies  Allergen Reactions   Levofloxacin Other (See Comments)    Leg pain and hardness    Immunization History  Administered Date(s) Administered   Fluad Quad(high Dose 65+) 02/18/2021, 02/06/2022   Influenza, High Dose Seasonal PF 02/05/2020   Influenza, Quadrivalent, Recombinant, Inj, Pf 12/31/2018   Influenza,inj,Quad PF,6+ Mos 01/16/2015   Influenza-Unspecified 02/06/2017, 02/04/2018   Pneumococcal Conjugate-13 03/05/2013   Pneumococcal Polysaccharide-23 05/06/2007, 11/27/2016   Tetanus 05/06/2007   Zoster Recombinat (Shingrix)  11/06/2016   Zoster, Live 05/06/2007    Past Medical History:  Diagnosis Date   Anxiety    Arthritis    Cancer ([place])    colon cancer 2007   Cataract    Chest pain 12/03/2016   Chronic obstructive pulmon disease w acute lower resp infct ([place]) 11/22/2015   Chodri.   Chronic respiratory failure with hypoxia ([place]) 06/04/2018   02 dep since 2015 though still not needing at rest as recently as 04/2018    COPD  pfts pending 06/03/2018   Quit smoking 2007 - 02 dep 2015  - 06/03/2018  After extensive coaching inhaler device,  effectiveness =   75% - alpha one AT screen 06/03/2018      COPD (chronic obstructive pulmonary disease) ([place])    patient diagnosed in 2013   COPD exacerbation ([place]) 05/10/2018   Essential hypertension 11/22/2015   Mild permissive htn.  Orthostatic hypotension can occur.   Hyperlipidemia 12/03/2016   Hypertension    Malaise and fatigue 11/22/2015   Mitral valve disorder 06/11/2018   Pneumonia    Right thyroid nodule 06/11/2018   Sepsis ([place])    SOB (shortness of breath) 12/03/2016   TIA (transient ischemic attack)     Tobacco History: Social History   Tobacco Use  Smoking Status Former   Packs/day: 1.50   Years: 40.00   Total pack years: 60.00   Types: Cigarettes   Quit date: 05/18/2005   Years since quitting: 17.0  Smokeless Tobacco Never   Counseling given: Not Answered   Outpatient Medications Prior to Visit  Medication Sig Dispense Refill   albuterol (PROAIR HFA) 108 (90 Base)  MCG/ACT inhaler Up to 2 puffs every 4 hours if needed 6.7 g 11   aspirin EC 81 MG tablet Take 81 mg by mouth daily.      b complex vitamins capsule Take 1 capsule by mouth daily.      Biotin 10000 MCG TABS Take 1 capsule by mouth daily.     CALCIUM PO Take 1 tablet by mouth daily.     Cholecalciferol 25 MCG (1000 UT) tablet Take 1,000 Units by mouth daily.      fluticasone (FLONASE) 50 MCG/ACT nasal spray Place 1 spray into both nostrils daily as needed.      Fluticasone-Umeclidin-Vilant  (TRELEGY ELLIPTA) 100-62.5-25 MCG/ACT AEPB INHALE ONE PUFF INTO THE LUNGS DAILY 60 each 6   furosemide (LASIX) 20 MG tablet Take 10 mg by mouth daily as needed.     levalbuterol (XOPENEX) 0.63 MG/3ML nebulizer solution Take 0.63 mg by nebulization every 6 (six) hours as needed for wheezing or shortness of breath.     Omega-3 1000 MG CAPS Take 1 capsule by mouth 2 (two) times daily.      OXYGEN 3lpm with sleep and then as needed during the day  [facility]     pravastatin (PRAVACHOL) 20 MG tablet Take 20 mg by mouth daily.     sertraline (ZOLOFT) 50 MG tablet Take 50 mg by mouth daily.      telmisartan (MICARDIS) 80 MG tablet Take 1 tablet (80 mg total) by mouth daily. 30 tablet 11   TURMERIC PO Take 1 capsule by mouth daily.     albuterol (PROVENTIL) (2.5 MG/3ML) 0.083% nebulizer solution Take 1.5 mLs (1.25 mg total) by nebulization every 6 (six) hours as needed for wheezing or shortness of breath. Use only half of a vial. (Patient not taking: Reported on 05/19/2022) 120 mL 12   predniSONE (DELTASONE) 10 MG tablet Take 4 tabs for 2 days, then 3 tabs for 2 days, 2 tabs for 2 days, then 1 tab for 2 days, then stop. (Patient not taking: Reported on 05/19/2022) 20 tablet 0   No facility-administered medications prior to visit.     Review of Systems:   Constitutional:   No  weight loss, night sweats,  Fevers, chills, +fatigue, or  lassitude.  HEENT:   No headaches,  Difficulty swallowing,  Tooth/dental problems, or  Sore throat,                No sneezing, itching, ear ache, nasal congestion, post nasal drip,   CV:  No chest pain,  Orthopnea, PND,  anasarca, dizziness, palpitations, syncope.   GI  No heartburn, indigestion, abdominal pain, nausea, vomiting, diarrhea, change in bowel habits, loss of appetite, bloody stools.   Resp:  No chest wall deformity  Skin: no rash or lesions.  GU: no dysuria, change in color of urine, no urgency or frequency.  No flank pain, no hematuria   MS:  No  joint pain or swelling.  No decreased range of motion.  No back pain.    Physical Exam  BP 118/80 (BP Location: Right Arm, Patient Position: Sitting, Cuff Size: Normal)   Pulse 65   Temp 98.2 F (36.8 C) (Oral)   Ht 5' (1.524 m)   Wt 88 lb 12.8 oz (40.3 kg)   BMI 17.34 kg/m   GEN: A/Ox3; pleasant , NAD, thin, frail, elderly, on O2   HEENT:  [place]/AT,  EACs-clear, TMs-wnl, NOSE-clear, THROAT-clear, no lesions, no postnasal drip or exudate noted.   NECK:  Supple w/ fair ROM; no JVD; normal carotid impulses w/o bruits; no thyromegaly or nodules palpated; no lymphadenopathy.    RESP decreased breath sounds in the bases  no accessory muscle use, no dullness to percussion  CARD:  RRR, no m/r/g, tr  peripheral edema, pulses intact, no cyanosis or clubbing.  GI:   Soft & nt; nml bowel sounds; no organomegaly or masses detected.   Musco: Warm bil, no deformities or joint swelling noted.   Neuro: alert, no focal deficits noted.    Skin: Warm, no lesions or rashes    Lab Results:  CBC   ProBNP No results found for: "PROBNP"  Imaging: No results found.        No data to display          No results found for: "NITRICOXIDE"      Assessment & Plan:   COPD exacerbation ([place]) Recent COPD exacerbation plus or minus pneumonia clinically improved.  Will check chest x-ray on return visit.  Clinically is improved.  Plan  Patient Instructions  Continue on Trelegy 1 puff daily , rinse after use.  Albuterol inhaler or neb As needed   Continue on Oxygen 3l/m at rest and 5l/m with activity   I recommend RSV vaccine.  High protein diet.  Activity as tolerated.  Follow up with Dr. Melvyn Novas  in 6-8 weeks and As needed  , with chest xray  Please contact office for sooner follow up if symptoms do not improve or worsen or seek emergency care       Pneumonia Right-sided opacities consistent with pneumonia.  Check chest x-ray on return.  Clinically has improved with antibiotic  therapy.  Plan  Patient Instructions  Continue on Trelegy 1 puff daily , rinse after use.  Albuterol inhaler or neb As needed   Continue on Oxygen 3l/m at rest and 5l/m with activity   I recommend RSV vaccine.  High protein diet.  Activity as tolerated.  Follow up with Dr. Melvyn Novas  in 6-8 weeks and As needed  , with chest xray  Please contact office for sooner follow up if symptoms do not improve or worsen or seek emergency care       Chronic respiratory failure with hypoxia ([place]) Continue on oxygen to maintain O2 saturations greater than 88 to 90%     Rexene Edison, NP 05/19/2022

## 2022-05-19 NOTE — Assessment & Plan Note (Signed)
Continue on oxygen to maintain O2 saturations greater than 88 to 90%

## 2022-05-19 NOTE — Assessment & Plan Note (Addendum)
Recent COPD exacerbation plus or minus pneumonia clinically improved.  Will check chest x-ray on return visit.  Clinically is improved.  Plan  Patient Instructions  Continue on Trelegy 1 puff daily , rinse after use.  Albuterol inhaler or neb As needed   Continue on Oxygen 3l/m at rest and 5l/m with activity   I recommend RSV vaccine.  High protein diet.  Activity as tolerated.  Follow up with Dr. Melvyn Novas  in 6-8 weeks and As needed  , with chest xray  Please contact office for sooner follow up if symptoms do not improve or worsen or seek emergency care

## 2022-05-26 ENCOUNTER — Ambulatory Visit: Payer: PPO | Admitting: Internal Medicine

## 2022-06-30 ENCOUNTER — Ambulatory Visit: Payer: PPO | Admitting: Adult Health

## 2022-07-07 ENCOUNTER — Ambulatory Visit (INDEPENDENT_AMBULATORY_CARE_PROVIDER_SITE_OTHER): Payer: PPO | Admitting: Internal Medicine

## 2022-07-07 ENCOUNTER — Encounter: Payer: Self-pay | Admitting: Internal Medicine

## 2022-07-07 ENCOUNTER — Ambulatory Visit (INDEPENDENT_AMBULATORY_CARE_PROVIDER_SITE_OTHER): Payer: PPO

## 2022-07-07 VITALS — BP 132/80 | HR 75 | Temp 98.0°F | Ht 60.0 in | Wt 89.6 lb

## 2022-07-07 DIAGNOSIS — J449 Chronic obstructive pulmonary disease, unspecified: Secondary | ICD-10-CM | POA: Diagnosis not present

## 2022-07-07 DIAGNOSIS — J9611 Chronic respiratory failure with hypoxia: Secondary | ICD-10-CM

## 2022-07-07 DIAGNOSIS — J189 Pneumonia, unspecified organism: Secondary | ICD-10-CM

## 2022-07-07 MED ORDER — BREZTRI AEROSPHERE 160-9-4.8 MCG/ACT IN AERO: 2.0000 | INHALATION_SPRAY | Freq: Two times a day (BID) | RESPIRATORY_TRACT | 11 refills | Status: DC

## 2022-07-07 MED ORDER — BREZTRI AEROSPHERE 160-9-4.8 MCG/ACT IN AERO: 2.0000 | INHALATION_SPRAY | Freq: Two times a day (BID) | RESPIRATORY_TRACT | 0 refills | Status: DC

## 2022-07-07 MED ORDER — LEVALBUTEROL HCL 0.63 MG/3ML IN NEBU: 0.6300 mg | INHALATION_SOLUTION | RESPIRATORY_TRACT | 11 refills | Status: DC | PRN

## 2022-07-07 NOTE — Assessment & Plan Note (Signed)
As of 07/07/2022  = 02: 3lpm at rest and 4-5 lpm with activity   Advised: Make sure you check your oxygen saturation  AT  your highest level of activity (not after you stop)   to be sure it stays over 90% and adjust  02 flow upward to maintain this level if needed but remember to turn it back to previous settings when you stop (to conserve your supply).          Each maintenance medication was reviewed in detail including emphasizing most importantly the difference between maintenance and prns and under what circumstances the prns are to be triggered using an action plan format where appropriate.  Total time for H and P, chart review, counseling, reviewing dpi/hfa/neb/02  device(s) and generating customized AVS unique to this office visit / same day charting = 40 min

## 2022-07-07 NOTE — Assessment & Plan Note (Signed)
Quit smoking 2007 - 02 dep 2015  - alpha one AT screen 06/03/2018  MM level 160 - Spirometry 06/17/2018  FEV1 0.6 (32%)  Ratio 0.50  p trelegy in am/ classic curvature   - 10/07/2018  After extensive coaching inhaler device,  effectiveness =    90% with dpi / 75% hfa   - 01/19/2020  After extensive coaching inhaler device,  effectiveness =    50% (Ti too short)  > try breztri 2 pffs x 15 min p am neb saba > did not time as advised - 03/12/2020  After extensive coaching inhaler device,  effectiveness =    75% rechallnge with breztri x 2 weeks and if not better back to trelegy but either way both are 1st thing in am as she doesn't get sob until starts her am activities  - 07/07/2022  After extensive coaching inhaler device,  effectiveness  < 25%( short ti, poor insp flow) > rec triple maint rx all per neb if covered    Group D (now reclassified as E) in terms of symptom/risk and laba/lama/ICS  therefore appropriate rx at this point >>>  formoterol/bud bid and yupelri q am all per neb based on severity of dz/ freq exac with recent admit, and extremely poor use of hfa and dpi options   Discussed in detail all the  indications, usual  risks and alternatives  relative to the benefits with patient who agrees to proceed with Rx as outlined.

## 2022-07-07 NOTE — Patient Instructions (Addendum)
Plan A = Automatic = Always=    Trelegy 100 1st thing each am (Breztri Take 2 puffs first thing in am and then another 2 puffs about 12 hours later.)  Work on inhaler technique:  relax and gently blow all the way out then take a nice smooth full deep breath back in, triggering the inhaler at same time you start breathing in.  Hold breath in for at least  5 seconds if you can. Blow out breztri  thru nose. Rinse and gargle with water when done.  If mouth or throat bother you at all,  try brushing teeth/gums/tongue with arm and hammer toothpaste/ make a slurry and gargle and spit out.      Plan B = Backup (to supplement plan A, not to replace it) Only use your albuterol inhaler as a rescue medication to be used if you can't catch your breath by resting or doing a relaxed purse lip breathing pattern.  - The less you use it, the better it will work when you need it. - Ok to use the inhaler up to 2 puffs  every 4 hours if you must but call for appointment if use goes up over your usual need - Don't leave home without it !!  (think of it like the spare tire for your car)   Plan C = Crisis (instead of Plan B but only if Plan B stops working) - only use your levoalbuterol nebulizer if you first try Plan B and it fails to help > ok to use the nebulizer up to every 4 hours but if start needing it regularly call for immediate appointment  Make sure you check your oxygen saturation  AT  your highest level of activity (not after you stop)   to be sure it stays over 90% and adjust  02 flow upward to maintain this level if needed but remember to turn it back to previous settings when you stop (to conserve your supply).   Please remember to go to the  x-ray department  for your tests - we will call you with the results when they are available    Please schedule a follow up visit in 3 months but call sooner if needed

## 2022-07-07 NOTE — Progress Notes (Signed)
Rachel Evans, female    DOB: 09-13-1940     MRN: [id number]   Brief patient profile:  54  yowf  MM/quit smoking 2007 with doe to point where trouble steps but did not req 02 or meds but gradually worse and by  2015 rx= various inhalers and even 02 but didn't use consistently with doe = MMRC2   then acutely worse late Oct 2019 green sputum on trelegy rx levaquin > sore leg tendons so rx augmentin which helped mucus but breathing worse rx nebulizer xopenex then admitted 04/26/18 with "pna/sepsis"@ [facility]  Floor bed > d/c May 03 2018 on IV ancef which was complete Jan 15th 2020 and referred to pulmonary clinic 06/03/2018 by Dr   Gilford Rile.with GOLD III criteria by spirometry 06/17/2018     History of Present Illness  06/03/2018  Pulmonary/ 1st office eval/Rachel Evans  Chief Complaint  Patient presents with   Pulmonary Consult    Referred by Dr. Gilford Rile. Pt states had PNA 04/26/18. She c/o SOB "sometimes"- occ gets winded walking room to room. She has occ cough with clear sputum.   Dyspnea:  MMRC3 = can't walk 100 yards even at a slow pace at a flat grade s stopping due to sob   Cough: clear /no am flares Sleep: on side hob on 1 pillow flat bed  SABA use: not using proair/ has neb with levoalbuterol / back on trelgy now / a bit confused when when / how to use saba  02  None at rest/ 3lpm hs and 3 pulsed  out Typically sats are maint  low 90s at rest RA and upper 80s on POC rec Plan A = Automatic = Trelegy one click daily  Plan B = Backup Only use your albuterol(proair) inhaler as a rescue medication  Work on inhaler technique:  Plan C = Crisis - only use your levoalbuterol nebulizer if you first try Plan B       01/19/2020  f/u ov/Rachel Evans re:  Copd III / 02 dep / trelegy maint  Chief Complaint  Patient presents with   Follow-up    COPD, SOB is worse  Dyspnea:  Sob 50 ft  Cough: no  Sleeping: bed flat, big pillow  SABA use: never prechallenges  02: 2-3 lpm and 4-5 with activity   rec For cough > mucinex 1200 mg every 12 hours as needed and use the flutter valve  Plan A = Automatic = Always=    Breztri Take 2 puffs first thing in am and then another 2 puffs about 12 hours later.  Work on inhaler technique:  Prednisone 10 mg Take 4 for three days 3 for three days 2 for three days 1 for three days and stop  Plan B = Backup (to supplement plan A, not to replace it) Only use your albuterol inhaler as a rescue medication Plan C = Crisis (instead of Plan B but only if Plan B stops working) - only use your albuterol nebulizer if you first try Plan B and it fails to help > ok to use the nebulizer up to every 4 hours but if start needing it regularly call for immediate appointment   S/p [facility] for aecopd / did not follow action plan   07/07/2022  f/u ov/Rachel Evans re: GOLD 3/ 02 dep  maint on Trelegy 100  / no longer on prednisone  Chief Complaint  Patient presents with   Follow-up    Breathing is  baseline no sob  at rest. She uses xopenex neb daily. She has noticed some occ swelling in her ankles since Jan 2024.   Dyspnea:  room to room at home  Cough: none but sensation of globus on trelegy with freq throat clearing  Sleeping: level one pillow with legs elevated > resolves edema  SABA use: neb 1st thing in am then Trelegy 100 02: 3lpm at rest and 4-5 lpm with activity  Covid status: vax max    No obvious day to day or daytime variability or assoc excess/ purulent sputum or mucus plugs or hemoptysis or cp or chest tightness, subjective wheeze or overt sinus or hb symptoms.   Sleepoing  without nocturnal  or early am exacerbation  of respiratory  c/o's or need for noct saba. Also denies any obvious fluctuation of symptoms with weather or environmental changes or other aggravating or alleviating factors except as outlined above   No unusual exposure hx or h/o childhood pna/ asthma or knowledge of premature birth.  Current Allergies, Complete Past Medical History, Past  Surgical History, Family History, and Social History were reviewed in [employer] record.  ROS  The following are not active complaints unless bolded Hoarseness, sore throat, dysphagia, dental problems, itching, sneezing,  nasal congestion or discharge of excess mucus or purulent secretions, ear ache,   fever, chills, sweats, unintended wt loss or wt gain, classically pleuritic or exertional cp,  orthopnea pnd or arm/hand swelling  or leg swelling, presyncope, palpitations, abdominal pain, anorexia, nausea, vomiting, diarrhea  or change in bowel habits or change in bladder habits, change in stools or change in urine, dysuria, hematuria,  rash, arthralgias, visual complaints, headache, numbness, weakness or ataxia or problems with walking or coordination,  change in mood or  memory.        Current Meds  Medication Sig   albuterol (PROAIR HFA) 108 (90 Base) MCG/ACT inhaler Up to 2 puffs every 4 hours if needed   ALPRAZolam (XANAX) 0.25 MG tablet Take 0.25 mg by mouth 2 (two) times daily as needed for anxiety.   aspirin EC 81 MG tablet Take 81 mg by mouth daily.    b complex vitamins capsule Take 1 capsule by mouth daily.    Biotin 10000 MCG TABS Take 1 capsule by mouth daily.   CALCIUM PO Take 1 tablet by mouth daily.   Cholecalciferol 25 MCG (1000 UT) tablet Take 1,000 Units by mouth daily.    fluticasone (FLONASE) 50 MCG/ACT nasal spray Place 1 spray into both nostrils daily as needed.    Fluticasone-Umeclidin-Vilant (TRELEGY ELLIPTA) 100-62.5-25 MCG/ACT AEPB INHALE ONE PUFF INTO THE LUNGS DAILY   furosemide (LASIX) 20 MG tablet Take 10 mg by mouth daily as needed.   levalbuterol (XOPENEX) 0.63 MG/3ML nebulizer solution Take 0.63 mg by nebulization every 6 (six) hours as needed for wheezing or shortness of breath.   Omega-3 1000 MG CAPS Take 1 capsule by mouth 2 (two) times daily.    OXYGEN 3lpm with sleep and then as needed during the day  [employer]   pravastatin  (PRAVACHOL) 20 MG tablet Take 20 mg by mouth daily.   sertraline (ZOLOFT) 50 MG tablet Take 50 mg by mouth daily.    telmisartan (MICARDIS) 80 MG tablet Take 1 tablet (80 mg total) by mouth daily.   TURMERIC PO Take 1 capsule by mouth daily.              Objective:    Wts   07/07/2022  89  09/23/2021        96 03/19/2021      96   09/10/2020         93 03/12/2020        97  01/19/2020        96 09/12/2019        98 03/11/2019        99 10/07/2018          97   06/17/18 95 lb 12.8 oz (43.5 kg)  06/03/18 96 lb 9.6 oz (43.8 kg)  05/27/18 98 lb 9.6 oz (44.7 kg)    Vital signs reviewed  07/07/2022  - Note at rest 02 sats  90% on 3lpm pulsed    General appearance:    w/c bound slt hoarse wf nad    HEENT :  Oropharynx  clear       NECK :  without JVD/Nodes/TM/ nl carotid upstrokes bilaterally   LUNGS: no acc muscle use,  Mod barrel  contour chest wall with bilateral  Distant bs s audible wheeze and  without cough on insp or exp maneuvers and mod  Hyperresonant  to  percussion bilaterally     CV:  RRR  no s3 or murmur or increase in P2, and no edema   ABD:  soft and nontender with pos mid insp Hoover's  in the supine position. No bruits or organomegaly appreciated, bowel sounds nl  MS:   Ext warm without deformities or   obvious joint restrictions , calf tenderness, cyanosis or clubbing  SKIN: warm and dry without lesions    NEURO:  alert, approp, nl sensorium with  no motor or cerebellar deficits apparent.         CXR PA and Lateral:   07/07/2022 :    I personally reviewed images and agree with radiology impression as follows:    Advanced emphysema, with improving mixed interstitial and airspace disease in the lower lungs compatible with resolving pneumonia        Assessment

## 2022-07-08 NOTE — Addendum Note (Signed)
Addended by: Christinia Gully B on: 07/08/2022 05:34 AM   Modules accepted: Level of Service

## 2022-09-03 ENCOUNTER — Telehealth: Payer: Self-pay | Admitting: Internal Medicine

## 2022-09-03 MED ORDER — PREDNISONE 10 MG PO TABS: ORAL_TABLET | ORAL | 0 refills | Status: DC

## 2022-09-03 NOTE — Telephone Encounter (Signed)
Patient called to request that the doctor refill her prednisone.  She stated that she is having some difficulty breathing and this usually helps.  Please advise.  CB# [phone]

## 2022-09-03 NOTE — Telephone Encounter (Signed)
Advised patient prednisone has been sent to pharmacy. Went over recc from [place]. Advised patient to call back if no  improvement for OV. She verbalized understanding. NFN

## 2022-09-03 NOTE — Telephone Encounter (Signed)
She was last seen in March by Dr. Sherene Sires. Followed by our office for COPD. Ok to send in prednisone. Continue Breztri twice daily, use nebulizer every 6 hours. Should be using nasal spray like flonase for nasal congestion. If not better needs OV in office.

## 2022-09-03 NOTE — Telephone Encounter (Signed)
Spoke with patient. She complains of increased sob over the last weeks feels its related to the pollen and also some nasal stuffiness, unable to cough up phlegm. Patient is requesting a script of prednisone  Beth can you please advise since Dr. Sherene Sires is off  Pharmacy [facility] [address]

## 2022-10-13 ENCOUNTER — Ambulatory Visit: Payer: PPO | Admitting: Internal Medicine

## 2022-10-17 ENCOUNTER — Telehealth: Payer: Self-pay | Admitting: Internal Medicine

## 2022-10-17 MED ORDER — PREDNISONE 10 MG PO TABS: ORAL_TABLET | ORAL | 0 refills | Status: DC

## 2022-10-17 NOTE — Telephone Encounter (Signed)
Called and spoke with patient. She stated that she has been more SOB over the past few days. She believes it is coming from the humid, warm weather. She also has a cough but has not been able to cough up any phlegm. Slight increase in wheezing. She denied any fevers or body aches. Also denied being around anyone who has been sick recently.   She confirmed that she has been using her Breztri two puffs twice daily as well as her levalbuterol nebulizer solution.   She wanted to know if she could have a prednisone taper sent in for her.   Pharmacy is [facility].   TP, can you please advise?

## 2022-10-17 NOTE — Telephone Encounter (Signed)
Patient called to request that the doctor refill her prednisone. She stated that she is having some difficulty breathing and this usually helps. Please advise. CB# [phone]   She does not want to go the weekend w/o it.   Pharm is [facility]

## 2022-10-17 NOTE — Telephone Encounter (Signed)
Yes that is fine, will send in Prednisone taper . Rx sent . If not improving will need sooner follow up .  ER or urgent care if not better or worse.   Please contact office for sooner follow up if symptoms do not improve or worsen or seek emergency care

## 2022-10-17 NOTE — Telephone Encounter (Signed)
Called and spoke with patient. She is aware the prednisone has been sent in her. She verbalized understanding.   Nothing further needed at time of call.

## 2022-11-10 ENCOUNTER — Telehealth: Payer: Self-pay | Admitting: Internal Medicine

## 2022-11-10 MED ORDER — PREDNISONE 10 MG PO TABS: 40.0000 mg | ORAL_TABLET | Freq: Every day | ORAL | 0 refills | Status: AC

## 2022-11-10 NOTE — Telephone Encounter (Signed)
Please send prescription for prednisone 40 mg a day for 5 days 

## 2022-11-10 NOTE — Telephone Encounter (Signed)
Called and spoke with patient. She verbalized understanding. RX for prednisone has been sent to pharmacy.   Nothing further needed at time of call.

## 2022-11-10 NOTE — Telephone Encounter (Signed)
ATC patient, call would not go through. Will attempt again later.

## 2022-11-10 NOTE — Telephone Encounter (Signed)
Primary Pulmonologist: Wert Last office visit and with whom: 07/07/2022 What do we see them for (pulmonary problems): COPD, chronic respiratory failure with hypoxia Last OV assessment/plan:   Assessment             Patient Instructions by Nyoka Cowden, MD at 07/07/2022 2:00 PM  Author: Nyoka Cowden, MD Author Type: Physician Filed: 07/07/2022  2:33 PM  Note Status: Addendum Cosign: Cosign Not Required Encounter Date: 07/07/2022  Editor: Nyoka Cowden, MD (Physician)      Prior Versions: 1. Nyoka Cowden, MD (Physician) at 07/07/2022  2:20 PM - Addendum   2. Nyoka Cowden, MD (Physician) at 07/07/2022  2:18 PM - Signed  Plan A = Automatic = Always=    Trelegy 100 1st thing each am (Breztri Take 2 puffs first thing in am and then another 2 puffs about 12 hours later.)   Work on inhaler technique:  relax and gently blow all the way out then take a nice smooth full deep breath back in, triggering the inhaler at same time you start breathing in.  Hold breath in for at least  5 seconds if you can. Blow out breztri  thru nose. Rinse and gargle with water when done.  If mouth or throat bother you at all,  try brushing teeth/gums/tongue with arm and hammer toothpaste/ make a slurry and gargle and spit out.       Plan B = Backup (to supplement plan A, not to replace it) Only use your albuterol inhaler as a rescue medication to be used if you can't catch your breath by resting or doing a relaxed purse lip breathing pattern.  - The less you use it, the better it will work when you need it. - Ok to use the inhaler up to 2 puffs  every 4 hours if you must but call for appointment if use goes up over your usual need - Don't leave home without it !!  (think of it like the spare tire for your car)    Plan C = Crisis (instead of Plan B but only if Plan B stops working) - only use your levoalbuterol nebulizer if you first try Plan B and it fails to help > ok to use the nebulizer up to every 4 hours  but if start needing it regularly call for immediate appointment   Make sure you check your oxygen saturation  AT  your highest level of activity (not after you stop)   to be sure it stays over 90% and adjust  02 flow upward to maintain this level if needed but remember to turn it back to previous settings when you stop (to conserve your supply).    Please remember to go to the  x-ray department  for your tests - we will call you with the results when they are available     Please schedule a follow up visit in 3 months but call sooner if needed              Orthostatic Vitals Recorded in This Encounter   07/07/2022 1403     BP Location: Left Arm  Cuff Size: Normal   Instructions  Plan A = Automatic = Always=    Trelegy 100 1st thing each am (Breztri Take 2 puffs first thing in am and then another 2 puffs about 12 hours later.)   Work on inhaler technique:  relax and gently blow all the way out then take a  nice smooth full deep breath back in, triggering the inhaler at same time you start breathing in.  Hold breath in for at least  5 seconds if you can. Blow out breztri  thru nose. Rinse and gargle with water when done.  If mouth or throat bother you at all,  try brushing teeth/gums/tongue with arm and hammer toothpaste/ make a slurry and gargle and spit out.       Plan B = Backup (to supplement plan A, not to replace it) Only use your albuterol inhaler as a rescue medication to be used if you can't catch your breath by resting or doing a relaxed purse lip breathing pattern.  - The less you use it, the better it will work when you need it. - Ok to use the inhaler up to 2 puffs  every 4 hours if you must but call for appointment if use goes up over your usual need - Don't leave home without it !!  (think of it like the spare tire for your car)    Plan C = Crisis (instead of Plan B but only if Plan B stops working) - only use your levoalbuterol nebulizer if you first try Plan B and it  fails to help > ok to use the nebulizer up to every 4 hours but if start needing it regularly call for immediate appointment   Make sure you check your oxygen saturation  AT  your highest level of activity (not after you stop)   to be sure it stays over 90% and adjust  02 flow upward to maintain this level if needed but remember to turn it back to previous settings when you stop (to conserve your supply).    Please remember to go to the  x-ray department  for your tests - we will call you with the results when they are available     Please schedule a follow up visit in 3 months but call sooner if needed       Was appointment offered to patient (explain)?  Has f/u on 8/12.  No acute visits available.   Reason for call: Increase sob for the past week.  Has some cough with some mucous that will not come up.  Denies any fever, chills or body aches.  Using Albuterol inhaler as needed, gets relief for a little while.  She is using the Breztri inhaler 2 puffs twice daily.  She is using nebulizer daily (Xopenex).  She says she has not been outside in the heat/humidity.  Using oxygen at 3L 24/7.  Oxygen levels are 92% on 3-4L.  If she gets really exerted on 3L she drops to 3L.  She turns her oxygen up to 4L with exertion.  She states she was on some prednisone the 3rd week of June for a flare up.  She is requesting some prednisone.  She has a follow up scheduled with Dr. Sherene Sires on August 12th when she will need to requalify for her oxygen.  Advised that Dr. Sherene Sires is out of the office and will be back on 7/10, but I will send the message to the doc of the day.  I let her know once we hear back from him we will call her back with his recommendations.  She verbalized understanding.   (examples of things to ask: : When did symptoms start? Fever? Cough? Productive? Color to sputum? More sputum than usual? Wheezing? Have you needed increased oxygen? Are you taking your respiratory medications? What over  the counter  measures have you tried?)  Allergies  Allergen Reactions   Levofloxacin Other (See Comments)    Leg pain and hardness    Immunization History  Administered Date(s) Administered   Fluad Quad(high Dose 65+) 02/18/2021, 02/06/2022   Influenza, High Dose Seasonal PF 02/05/2020   Influenza, Quadrivalent, Recombinant, Inj, Pf 12/31/2018   Influenza,inj,Quad PF,6+ Mos 01/16/2015   Influenza-Unspecified 02/06/2017, 02/04/2018   Pneumococcal Conjugate-13 03/05/2013   Pneumococcal Polysaccharide-23 05/06/2007, 11/27/2016   Tetanus 05/06/2007   Zoster Recombinant(Shingrix) 11/06/2016   Zoster, Live 05/06/2007

## 2022-12-14 NOTE — Progress Notes (Unsigned)
Rachel Evans, female    DOB: 03-Jan-1941     MRN: [id number]   Brief patient profile:  52  yowf  MM/quit smoking 2007 with doe to point where trouble steps but did not req 02 or meds but gradually worse and by  2015 rx= various inhalers and even 02 but didn't use consistently with doe = MMRC2   then acutely worse late Oct 2019 green sputum on trelegy rx levaquin > sore leg tendons so rx augmentin which helped mucus but breathing worse rx nebulizer xopenex then admitted 04/26/18 with "pna/sepsis"@ [place]  Floor bed > d/c May 03 2018 on IV ancef which was complete Jan 15th 2020 and referred to pulmonary clinic 06/03/2018 by Dr   Feliciana Rossetti.with GOLD III criteria by spirometry 06/17/2018     History of Present Illness  06/03/2018  Pulmonary/ 1st office eval/Rachel Evans  Chief Complaint  Patient presents with   Pulmonary Consult    Referred by Dr. Feliciana Rossetti. Pt states had PNA 04/26/18. She c/o SOB "sometimes"- occ gets winded walking room to room. She has occ cough with clear sputum.   Dyspnea:  MMRC3 = can't walk 100 yards even at a slow pace at a flat grade s stopping due to sob   Cough: clear /no am flares Sleep: on side hob on 1 pillow flat bed  SABA use: not using proair/ has neb with levoalbuterol / back on trelgy now / a bit confused when when / how to use saba  02  None at rest/ 3lpm hs and 3 pulsed  out Typically sats are maint  low 90s at rest RA and upper 80s on POC rec Plan A = Automatic = Trelegy one click daily  Plan B = Backup Only use your albuterol(proair) inhaler as a rescue medication  Work on inhaler technique:  Plan C = Crisis - only use your levoalbuterol nebulizer if you first try Plan B       01/19/2020  f/u ov/Rachel Evans re:  Copd III / 02 dep / trelegy maint  Chief Complaint  Patient presents with   Follow-up    COPD, SOB is worse  Dyspnea:  Sob 50 ft  Cough: no  Sleeping: bed flat, big pillow  SABA use: never prechallenges  02: 2-3 lpm and 4-5 with activity   rec For cough > mucinex 1200 mg every 12 hours as needed and use the flutter valve  Plan A = Automatic = Always=    Breztri Take 2 puffs first thing in am and then another 2 puffs about 12 hours later.  Work on inhaler technique:  Prednisone 10 mg Take 4 for three days 3 for three days 2 for three days 1 for three days and stop  Plan B = Backup (to supplement plan A, not to replace it) Only use your albuterol inhaler as a rescue medication Plan C = Crisis (instead of Plan B but only if Plan B stops working) - only use your albuterol nebulizer if you first try Plan B and it fails to help > ok to use the nebulizer up to every 4 hours but if start needing it regularly call for immediate appointment   S/p Admit [facility] for aecopd / did not follow action plan   07/07/2022  f/u ov/Rachel Evans re: GOLD 3/ 02 dep  maint on Trelegy 100  / no longer on prednisone  Chief Complaint  Patient presents with   Follow-up    Breathing is  baseline no sob  at rest. She uses xopenex neb daily. She has noticed some occ swelling in her ankles since Jan 2024.   Dyspnea:  room to room at home  Cough: none but sensation of globus on trelegy with freq throat clearing  Sleeping: level one pillow with legs elevated > resolves edema  SABA use: neb 1st thing in am then Trelegy 100 02: 3lpm at rest and 4-5 lpm with activity  Covid status: vax max  Rec Plan A = Automatic = Always=    Trelegy 100 1st thing each am (Breztri Take 2 puffs first thing in am and then another 2 puffs about 12 hours later.) Work on inhaler technique:  Plan B = Backup (to supplement plan A, not to replace it) Only use your albuterol inhaler as a rescue medication Plan C = Crisis (instead of Plan B but only if Plan B stops working) - only use your levoalbuterol nebulizer if you first try Plan B Make sure you check your oxygen saturation  AT  your highest level of activity (not after you stop)   to be sure it stays over 90%      12/15/2022 60m f/u  ov/Rachel Evans re: GOLD 3 /02 dep    maint on Breztri 2bid/ /  02 dep and needs recert Chief Complaint  Patient presents with   Follow-up    Breathing slightly worse since the last visit. She is using neb about once per day.   Dyspnea:  room to room, losing ground, better p prednisone  Cough: none  Sleeping: flat bed / one pillow  SABA use: before leaving house/ never neb pre challenge but still avg once daily neb  02: 3lpm hs and sitting and then up    No obvious day to day or daytime variability or assoc excess/ purulent sputum or mucus plugs or hemoptysis or cp or chest tightness, subjective wheeze or overt sinus or hb symptoms.     Also denies any obvious fluctuation of symptoms with weather or environmental changes or other aggravating or alleviating factors except as outlined above   No unusual exposure hx or h/o childhood pna/ asthma or knowledge of premature birth.  Current Allergies, Complete Past Medical History, Past Surgical History, Family History, and Social History were reviewed in [employer] record.  ROS  The following are not active complaints unless bolded Hoarseness, sore throat, dysphagia, dental problems, itching, sneezing,  nasal congestion or discharge of excess mucus or purulent secretions, ear ache,   fever, chills, sweats, unintended wt loss or wt gain, classically pleuritic or exertional cp,  orthopnea pnd or arm/hand swelling  or leg swelling, presyncope, palpitations, abdominal pain, anorexia, nausea, vomiting, diarrhea  or change in bowel habits or change in bladder habits, change in stools or change in urine, dysuria, hematuria,  rash, arthralgias, visual complaints, headache, numbness, weakness or ataxia or problems with walking or coordination,  change in mood or  memory.        Current Meds  Medication Sig   albuterol (PROAIR HFA) 108 (90 Base) MCG/ACT inhaler Up to 2 puffs every 4 hours if needed   ALPRAZolam (XANAX) 0.25 MG tablet Take  0.25 mg by mouth 2 (two) times daily as needed for anxiety.   aspirin EC 81 MG tablet Take 81 mg by mouth daily.    b complex vitamins capsule Take 1 capsule by mouth daily.    Biotin 09811 MCG TABS Take 1 capsule by mouth daily.   Budeson-Glycopyrrol-Formoterol (BREZTRI AEROSPHERE) 160-9-4.8  MCG/ACT AERO Inhale 2 puffs into the lungs 2 (two) times daily.   CALCIUM PO Take 1 tablet by mouth daily.   Cholecalciferol 25 MCG (1000 UT) tablet Take 1,000 Units by mouth daily.    fluticasone (FLONASE) 50 MCG/ACT nasal spray Place 1 spray into both nostrils daily as needed.    furosemide (LASIX) 20 MG tablet Take 10 mg by mouth daily as needed.   levalbuterol (XOPENEX) 0.63 MG/3ML nebulizer solution Take 3 mLs (0.63 mg total) by nebulization every 4 (four) hours as needed for wheezing or shortness of breath.   Omega-3 1000 MG CAPS Take 1 capsule by mouth 2 (two) times daily.    OXYGEN 3lpm with sleep and then as needed during the day  [facility]   pravastatin (PRAVACHOL) 20 MG tablet Take 20 mg by mouth daily.   sertraline (ZOLOFT) 50 MG tablet Take 50 mg by mouth daily.    telmisartan (MICARDIS) 80 MG tablet Take 1 tablet (80 mg total) by mouth daily.   TURMERIC PO Take 1 capsule by mouth daily.           Objective:    Wts   12/15/2022        86  07/07/2022         89  09/23/2021        96 03/19/2021      96   09/10/2020         93 03/12/2020        97  01/19/2020        96 09/12/2019        98 03/11/2019        99 10/07/2018          97   06/17/18 95 lb 12.8 oz (43.5 kg)  06/03/18 96 lb 9.6 oz (43.8 kg)  05/27/18 98 lb 9.6 oz (44.7 kg)    Vital signs reviewed  12/15/2022  - Note at rest 02 sats  95% on 3lpm POC    General appearance:    w/c bound eldelry wf nad  HEENT :  Oropharynx  clear     NECK :  without JVD/Nodes/TM/ nl carotid upstrokes bilaterally   LUNGS: no acc muscle use,  Mod barrel  contour chest wall with bilateral  Distant bs s audible wheeze and  without cough on insp  or exp maneuvers and mod  Hyperresonant  to  percussion bilaterally     CV:  RRR  no s3 or murmur or increase in P2, and no edema   ABD:  soft and nontender with pos mid insp Hoover's  in the supine position. No bruits or organomegaly appreciated, bowel sounds nl  MS:   Ext warm without deformities or   obvious joint restrictions , calf tenderness, cyanosis or clubbing  SKIN: warm and dry without lesions    NEURO:  alert, approp, nl sensorium with  no motor or cerebellar deficits apparent.                        Assessment

## 2022-12-15 ENCOUNTER — Encounter: Payer: Self-pay | Admitting: Internal Medicine

## 2022-12-15 ENCOUNTER — Ambulatory Visit: Payer: PPO | Admitting: Internal Medicine

## 2022-12-15 VITALS — BP 122/68 | HR 95 | Ht 60.0 in | Wt 86.6 lb

## 2022-12-15 DIAGNOSIS — J449 Chronic obstructive pulmonary disease, unspecified: Secondary | ICD-10-CM | POA: Diagnosis not present

## 2022-12-15 DIAGNOSIS — J9611 Chronic respiratory failure with hypoxia: Secondary | ICD-10-CM

## 2022-12-15 MED ORDER — PREDNISONE 10 MG PO TABS: ORAL_TABLET | ORAL | 11 refills | Status: DC

## 2022-12-15 NOTE — Assessment & Plan Note (Signed)
Quit smoking 2007 - 02 dep 2015  - Alpha one AT screen 06/03/2018  MM level 160 - Spirometry 06/17/2018  FEV1 0.6 (32%)  Ratio 0.50  p trelegy in am/ classic curvature   - 10/07/2018  After extensive coaching inhaler device,  effectiveness =    90% with dpi / 75% hfa   - 01/19/2020  After extensive coaching inhaler device,  effectiveness =    50% (Ti too short)  > try breztri 2 pffs x 15 min p am neb saba > did not time as advised - 03/12/2020  After extensive coaching inhaler device,  effectiveness =    75% rechallnge with breztri x 2 weeks and if not better back to trelegy but either way both are 1st thing in am as she doesn't get sob until starts her am activities  - 07/07/2022  After extensive coaching inhaler device,  effectiveness  < 25%( short ti, poor insp flow) > rec triple maint rx all per neb if covered  - 12/15/2022 added pred x 6 d as PLAN D    Group D (now reclassified as E) in terms of symptom/risk and laba/lama/ICS  therefore appropriate rx at this point >>>  Breztri 2bid and approp saba plus add refillable Pred x 6 d as Plan D on the ABCD action plan

## 2022-12-15 NOTE — Patient Instructions (Addendum)
Plan A = Automatic = Always=   Breztri Take 2 puffs first thing in am and then another 2 puffs about 12 hours later.     Work on inhaler technique:  relax and gently blow all the way out then take a nice smooth full deep breath back in, triggering the inhaler at same time you start breathing in.  Hold breath in for at least  5 seconds if you can. Blow out breztri  thru nose. Rinse and gargle with water when done.  If mouth or throat bother you at all,  try brushing teeth/gums/tongue with arm and hammer toothpaste/ make a slurry and gargle and spit out.      Plan B = Backup (to supplement plan A, not to replace it) Only use your albuterol inhaler as a rescue medication to be used if you can't catch your breath by resting or doing a relaxed purse lip breathing pattern.  - The less you use it, the better it will work when you need it. - Ok to use the inhaler up to 2 puffs  every 4 hours if you must but call for appointment if use goes up over your usual need - Don't leave home without it !!  (think of it like the spare tire for your car)   Plan C = Crisis (instead of Plan B but only if Plan B stops working) - only use your levoalbuterol nebulizer if you first try Plan B and it fails to help > ok to use the nebulizer up to every 4 hours but if start needing it regularly call for immediate appointment  Plan D= Deltasone = Prednisone  - when ABC not working :  refillable Prednisone 10 mg take  4 each am x 2 days,   2 each am x 2 days,  1 each am x 2 days and stop   Make sure you check your oxygen saturation  AT  your highest level of activity (not after you stop)   to be sure it stays over 90% and adjust  02 flow upward to maintain this level if needed but remember to turn it back to previous settings when you stop (to conserve your supply).     We will requalify you for home and portable 02    Please schedule a follow up visit in 6 months but call sooner if needed

## 2022-12-16 NOTE — Assessment & Plan Note (Signed)
02 dep since 2015   -  03/12/2020 :  Saturations on Room Air at Rest = 85%--increased to 92%5lpm pulsed o2 and walked 1 lap with sats and end 92% 5lpm pulsed.  - 12/15/2022 Patient Saturations on Room Air at Rest = 89% Patient Saturations on Room Air while Ambulating = 83% p 50 ft Patient Saturations on 4 Liters of pulsed oxygen while Ambulating = 93%  Pt is using 02 correctly and appears to be compliant and benefiting from both 02 concentrator and portable system.  Advised 3lpm sleeping and sitting and 4lpm with ambulation   F/u q 6 m  Each maintenance medication was reviewed in detail including emphasizing most importantly the difference between maintenance and prns and under what circumstances the prns are to be triggered using an action plan format where appropriate.  Total time for H and P, chart review, counseling, reviewing hfa/neb/02 device(s) , directly observing portions of ambulatory 02 saturation study/ and generating customized AVS unique to this office visit / same day charting = 35 min

## 2023-03-02 ENCOUNTER — Telehealth: Payer: Self-pay | Admitting: Internal Medicine

## 2023-03-02 NOTE — Telephone Encounter (Signed)
Patient says she was supposed to be qualified or a oxygen through [facility]

## 2023-03-04 ENCOUNTER — Encounter: Payer: Self-pay | Admitting: Internal Medicine

## 2023-03-04 DIAGNOSIS — J9611 Chronic respiratory failure with hypoxia: Secondary | ICD-10-CM

## 2023-03-04 DIAGNOSIS — J449 Chronic obstructive pulmonary disease, unspecified: Secondary | ICD-10-CM

## 2023-03-04 NOTE — Telephone Encounter (Signed)
Patient says she was supposed to be qualified or a oxygen through [facility] and still has heard nothing yet

## 2023-03-05 ENCOUNTER — Encounter: Payer: Self-pay | Admitting: Internal Medicine

## 2023-03-05 NOTE — Telephone Encounter (Signed)
Okay to place an order for a new oxygen concentrator and POC?

## 2023-03-06 ENCOUNTER — Telehealth: Payer: Self-pay | Admitting: Internal Medicine

## 2023-03-06 NOTE — Telephone Encounter (Signed)
This patient is needing to be treated as a new start. We will need the RX updated to continuous (So she qualifies for the POC) Sats/walk test results with-in the last 30days and F2F notes with in the last 90days.    Thank you. She does have O2 currently and understand she may need to be seen for Sats/walk test and F2F notes.

## 2023-03-06 NOTE — Addendum Note (Signed)
Addended by: Bonney Leitz on: 03/06/2023 04:06 PM   Modules accepted: Orders

## 2023-03-06 NOTE — Telephone Encounter (Signed)
Can you order this / ? Is there something I need to do here?

## 2023-03-06 NOTE — Telephone Encounter (Signed)
I have faxed the order and notes from 12/15/22 to [facility]. The patient will need to have new office visit and new 02 Stats for [facility] to process this order

## 2023-03-09 ENCOUNTER — Encounter: Payer: Self-pay | Admitting: Adult Health

## 2023-03-09 ENCOUNTER — Ambulatory Visit: Payer: PPO | Admitting: Adult Health

## 2023-03-09 VITALS — BP 159/93 | HR 82 | Ht 60.0 in | Wt 87.2 lb

## 2023-03-09 DIAGNOSIS — J449 Chronic obstructive pulmonary disease, unspecified: Secondary | ICD-10-CM

## 2023-03-09 DIAGNOSIS — J9611 Chronic respiratory failure with hypoxia: Secondary | ICD-10-CM

## 2023-03-09 DIAGNOSIS — Z23 Encounter for immunization: Secondary | ICD-10-CM

## 2023-03-09 DIAGNOSIS — E440 Moderate protein-calorie malnutrition: Secondary | ICD-10-CM | POA: Diagnosis not present

## 2023-03-09 NOTE — Patient Instructions (Addendum)
Continue on Breztri 2 puffs Twice daily, rinse after use.  Albuterol inhaler or neb As needed   Continue on Oxygen 3l/m at rest and 4l/m with activity   High protein diet.  Activity as tolerated.  TDAP vaccine .  Follow up with Dr. Sherene Sires  in 6 months and As needed   Please contact office for sooner follow up if symptoms do not improve or worsen or seek emergency care

## 2023-03-09 NOTE — Telephone Encounter (Signed)
Appointment has been scheduled.

## 2023-03-09 NOTE — Telephone Encounter (Signed)
Will forward encounter to front desk.  Patient will need office visit with Dr. Sherene Sires or APP for an OV and a qualifying walk to get oxygen concentrator for home as well as a POC .  Patient requesting oxygen be ordered with [facility] in [place] [phone]).

## 2023-03-09 NOTE — Progress Notes (Unsigned)
@Patient  ID: Rachel Evans, female    DOB: February 24, 1941, 82 y.o.   MRN: [id number]  Chief Complaint  Patient presents with   Follow-up    Pt states she needs a concentrator for home use.     Referring provider: Gordan Payment., MD  HPI:   TEST/EVENTS :  02 dep 2015  - alpha one AT screen 06/03/2018  MM level 160 - Spirometry 06/17/2018  FEV1 0.6 (32%)  Ratio 0.50   03/09/2023   Has poc     Allergies  Allergen Reactions   Levofloxacin Other (See Comments)    Leg pain and hardness    Immunization History  Administered Date(s) Administered   Fluad Quad(high Dose 65+) 02/18/2021, 02/06/2022   Influenza, High Dose Seasonal PF 02/05/2020   Influenza, Quadrivalent, Recombinant, Inj, Pf 12/31/2018   Influenza,inj,Quad PF,6+ Mos 01/16/2015   Influenza-Unspecified 02/06/2017, 02/04/2018   Pneumococcal Conjugate-13 03/05/2013   Pneumococcal Polysaccharide-23 05/06/2007, 11/27/2016   Tetanus 05/06/2007   Zoster Recombinant(Shingrix) 11/06/2016   Zoster, Live 05/06/2007    Past Medical History:  Diagnosis Date   Anxiety    Arthritis    Cancer (HCC)    colon cancer 2007   Cataract    Chest pain 12/03/2016   Chronic obstructive pulmon disease w acute lower resp infct (HCC) 11/22/2015   Chodri.   Chronic respiratory failure with hypoxia (HCC) 06/04/2018   02 dep since 2015 though still not needing at rest as recently as 04/2018    COPD  pfts pending 06/03/2018   Quit smoking 2007 - 02 dep 2015  - 06/03/2018  After extensive coaching inhaler device,  effectiveness =   75% - alpha one AT screen 06/03/2018      COPD (chronic obstructive pulmonary disease) (HCC)    patient diagnosed in 2013   COPD exacerbation (HCC) 05/10/2018   Essential hypertension 11/22/2015   Mild permissive htn.  Orthostatic hypotension can occur.   Hyperlipidemia 12/03/2016   Hypertension    Malaise and fatigue 11/22/2015   Mitral valve disorder 06/11/2018   Pneumonia    Right thyroid nodule 06/11/2018   Sepsis  (HCC)    SOB (shortness of breath) 12/03/2016   TIA (transient ischemic attack)     Tobacco History: Social History   Tobacco Use  Smoking Status Former   Current packs/day: 0.00   Average packs/day: 1.5 packs/day for 40.0 years (60.0 ttl pk-yrs)   Types: Cigarettes   Start date: 05/18/1965   Quit date: 05/18/2005   Years since quitting: 17.8  Smokeless Tobacco Never   Counseling given: Not Answered   Outpatient Medications Prior to Visit  Medication Sig Dispense Refill   albuterol (PROAIR HFA) 108 (90 Base) MCG/ACT inhaler Up to 2 puffs every 4 hours if needed 6.7 g 11   ALPRAZolam (XANAX) 0.25 MG tablet Take 0.25 mg by mouth 2 (two) times daily as needed for anxiety.     aspirin EC 81 MG tablet Take 81 mg by mouth daily.      b complex vitamins capsule Take 1 capsule by mouth daily.      Biotin 95621 MCG TABS Take 1 capsule by mouth daily.     Budeson-Glycopyrrol-Formoterol (BREZTRI AEROSPHERE) 160-9-4.8 MCG/ACT AERO Inhale 2 puffs into the lungs 2 (two) times daily. 10.7 g 11   CALCIUM PO Take 1 tablet by mouth daily.     Cholecalciferol 25 MCG (1000 UT) tablet Take 1,000 Units by mouth daily.      fluticasone (FLONASE) 50  MCG/ACT nasal spray Place 1 spray into both nostrils daily as needed.      furosemide (LASIX) 20 MG tablet Take 10 mg by mouth daily as needed.     levalbuterol (XOPENEX) 0.63 MG/3ML nebulizer solution Take 3 mLs (0.63 mg total) by nebulization every 4 (four) hours as needed for wheezing or shortness of breath. 120 mL 11   Omega-3 1000 MG CAPS Take 1 capsule by mouth 2 (two) times daily.      OXYGEN 3lpm with sleep and then as needed during the day  [facility]     pravastatin (PRAVACHOL) 20 MG tablet Take 20 mg by mouth daily.     predniSONE (DELTASONE) 10 MG tablet 4 tabs for 2 days, then 3 tabs for 2 days, 2 tabs for 2 days, then 1 tab for 2 days, then stop 20 tablet 11   sertraline (ZOLOFT) 50 MG tablet Take 50 mg by mouth daily.      telmisartan  (MICARDIS) 80 MG tablet Take 1 tablet (80 mg total) by mouth daily. 30 tablet 11   TURMERIC PO Take 1 capsule by mouth daily.     No facility-administered medications prior to visit.     Review of Systems:   Constitutional:   No  weight loss, night sweats,  Fevers, chills, fatigue, or  lassitude.  HEENT:   No headaches,  Difficulty swallowing,  Tooth/dental problems, or  Sore throat,                No sneezing, itching, ear ache, nasal congestion, post nasal drip,   CV:  No chest pain,  Orthopnea, PND, swelling in lower extremities, anasarca, dizziness, palpitations, syncope.   GI  No heartburn, indigestion, abdominal pain, nausea, vomiting, diarrhea, change in bowel habits, loss of appetite, bloody stools.   Resp: No shortness of breath with exertion or at rest.  No excess mucus, no productive cough,  No non-productive cough,  No coughing up of blood.  No change in color of mucus.  No wheezing.  No chest wall deformity  Skin: no rash or lesions.  GU: no dysuria, change in color of urine, no urgency or frequency.  No flank pain, no hematuria   MS:  No joint pain or swelling.  No decreased range of motion.  No back pain.    Physical Exam  BP (!) 159/93   Pulse 82   Ht 5' (1.524 m)   Wt 87 lb 3.2 oz (39.6 kg)   SpO2 92%   BMI 17.03 kg/m   GEN: A/Ox3; pleasant , NAD, well nourished    HEENT:  [place]/AT,  EACs-clear, TMs-wnl, NOSE-clear, THROAT-clear, no lesions, no postnasal drip or exudate noted.   NECK:  Supple w/ fair ROM; no JVD; normal carotid impulses w/o bruits; no thyromegaly or nodules palpated; no lymphadenopathy.    RESP  Clear  P & A; w/o, wheezes/ rales/ or rhonchi. no accessory muscle use, no dullness to percussion  CARD:  RRR, no m/r/g, no peripheral edema, pulses intact, no cyanosis or clubbing.  GI:   Soft & nt; nml bowel sounds; no organomegaly or masses detected.   Musco: Warm bil, no deformities or joint swelling noted.   Neuro: alert, no focal  deficits noted.    Skin: Warm, no lesions or rashes    Lab Results:  CBC    Component Value Date/Time   WBC 9.5 03/11/2019 1442   RBC 4.35 03/11/2019 1442   HGB 14.4 03/11/2019 1442   HCT 42.4  03/11/2019 1442   PLT 264.0 03/11/2019 1442   MCV 97.6 03/11/2019 1442   MCHC 33.9 03/11/2019 1442   RDW 13.8 03/11/2019 1442   LYMPHSABS 0.6 (L) 03/11/2019 1442   MONOABS 0.4 03/11/2019 1442   EOSABS 0.0 03/11/2019 1442   BASOSABS 0.0 03/11/2019 1442    BMET    Component Value Date/Time   NA 134 (L) 04/05/2019 1230   K 3.7 04/05/2019 1230   CL 99 04/05/2019 1230   CO2 27 04/05/2019 1230   GLUCOSE 95 04/05/2019 1230   BUN 14 04/05/2019 1230   CREATININE 0.58 04/05/2019 1230   CALCIUM 9.0 04/05/2019 1230    BNP No results found for: "BNP"  ProBNP No results found for: "PROBNP"  Imaging: No results found.  Administration History     None           No data to display          No results found for: "NITRICOXIDE"      Assessment & Plan:   No problem-specific Assessment & Plan notes found for this encounter.     Rubye Oaks, NP 03/09/2023

## 2023-03-09 NOTE — Telephone Encounter (Signed)
Patient seen TP today at 3:30. Order has been placed.  Nothing further needed.

## 2023-03-10 NOTE — Telephone Encounter (Signed)
Order got sent to [facility] in [place]. The patient lives in [place]. I spoke with Beth at [facility] and they are trying to get patient to transfer to them from previous DME

## 2023-04-16 ENCOUNTER — Telehealth: Payer: Self-pay | Admitting: Internal Medicine

## 2023-04-16 NOTE — Telephone Encounter (Signed)
Pt lost her rescue inhaler   [facility] - [place], [place] - [address]

## 2023-04-18 MED ORDER — ALBUTEROL SULFATE HFA 108 (90 BASE) MCG/ACT IN AERS: INHALATION_SPRAY | RESPIRATORY_TRACT | 3 refills | Status: DC

## 2023-04-18 NOTE — Telephone Encounter (Signed)
Albuterol refilled to preferred pharmacy. No further needs. Closing encounter.

## 2023-06-29 ENCOUNTER — Telehealth: Payer: Self-pay | Admitting: Internal Medicine

## 2023-06-29 NOTE — Telephone Encounter (Signed)
 I called and spoke to pt. Pt states she has had a hard time breathing x 1 month. Feet are swelling and everything she does causes her to become out of breath, even on oxygen. Pt is on 3 L at rest and 4 l on exertion. Pt states her o2 is usually around 94% - 95%. Pt stated she wanted to be seen. I informed pt we had openings tomorrow but with Dr Isaiah Serge. Pt stated this was fine, and I have her scheduled to see Dr Isaiah Serge at 1:00 pm tomorrow. Pt verbalized understanding. NFN

## 2023-06-29 NOTE — Telephone Encounter (Signed)
 Patient is having a hard time breathing.  She has tried prednisone but that hasn't helped. Please call and advise [phone]

## 2023-06-30 ENCOUNTER — Other Ambulatory Visit: Payer: Self-pay | Admitting: Pulmonary Disease

## 2023-06-30 ENCOUNTER — Telehealth: Payer: Self-pay | Admitting: Pulmonary Disease

## 2023-06-30 ENCOUNTER — Ambulatory Visit: Payer: PPO | Admitting: Pulmonary Disease

## 2023-06-30 ENCOUNTER — Ambulatory Visit: Payer: PPO

## 2023-06-30 VITALS — BP 187/82

## 2023-06-30 DIAGNOSIS — J441 Chronic obstructive pulmonary disease with (acute) exacerbation: Secondary | ICD-10-CM | POA: Diagnosis not present

## 2023-06-30 DIAGNOSIS — J9601 Acute respiratory failure with hypoxia: Secondary | ICD-10-CM

## 2023-06-30 DIAGNOSIS — J9611 Chronic respiratory failure with hypoxia: Secondary | ICD-10-CM

## 2023-06-30 MED ORDER — AZITHROMYCIN 250 MG PO TABS: 250.0000 mg | ORAL_TABLET | Freq: Every day | ORAL | 0 refills | Status: AC

## 2023-06-30 MED ORDER — PREDNISONE 10 MG PO TABS: 60.0000 mg | ORAL_TABLET | Freq: Every day | ORAL | 0 refills | Status: DC

## 2023-06-30 NOTE — Telephone Encounter (Signed)
 Spoke to Dr. Isaiah Serge vis epic secure chat and verified that prednisone Rx should be 60mg x2,50mg x2,40mg x2,30mg x2,20mg x2,10mg x2. Raynelle Fanning with [facility] is aware and voiced her understanding.  Nothing further needed.

## 2023-06-30 NOTE — Telephone Encounter (Signed)
 Rachel Evans needs directions for Prednisone taper. Also checking on quantity.  Rachel Evans phone number is [phone].

## 2023-06-30 NOTE — Progress Notes (Signed)
 Rachel Evans    [id number]    12/07/1940  Primary Care Physician:Grisso, Evlyn Courier., MD  Referring Physician: Gordan Payment., MD [address] [place],  [place] [address]  Chief complaint: Acute visit for COPD exacerbation  HPI: 83 y.o. who  has a past medical history of Anxiety, Arthritis, Cancer (HCC), Cataract, Chest pain (12/03/2016), Chronic obstructive pulmon disease w acute lower resp infct (HCC) (11/22/2015), Chronic respiratory failure with hypoxia (HCC) (06/04/2018), COPD  pfts pending (06/03/2018), COPD (chronic obstructive pulmonary disease) (HCC), COPD exacerbation (HCC) (05/10/2018), Essential hypertension (11/22/2015), Hyperlipidemia (12/03/2016), Hypertension, Malaise and fatigue (11/22/2015), Mitral valve disorder (06/11/2018), Pneumonia, Right thyroid nodule (06/11/2018), Sepsis (HCC), SOB (shortness of breath) (12/03/2016), and TIA (transient ischemic attack).   Discussed the use of AI scribe software for clinical note transcription with the patient, who gave verbal consent to proceed.  Rachel Evans is an 83 year old female with COPD who presents with worsening shortness of breath.  Over the past four weeks, she has experienced significant shortness of breath that has progressively worsened, affecting her ability to perform daily activities. She manages at home if she remains relaxed.  She uses home oxygen therapy, requiring 3 liters while sitting and up to 5 liters when active. She began a course of prednisone at the end of January, which provided some relief. However, her symptoms persisted, leading her to start a second course of prednisone two weeks later. She is currently on a tapering dose, having started at four pills per day and is now down to two pills per day, noting some improvement with this regimen.  Her current medications include Breztri inhaler, used as two puffs in the morning and evening, and a nebulizer with levalbuterol, which she uses at least twice daily.  She also uses a rescue inhaler, though she finds it difficult to get the medication deep into her lungs.  There is a recent onset of a mild cough and light brown sputum production, which is a change from her usual symptoms. She also mentions experiencing some wheezing, though she is not very familiar with the sensation.  Her past medical history includes a significant episode of pneumonia in December 2019, which was complicated by a blood infection. She expresses concern about her current condition potentially being similar to that past experience.    Outpatient Encounter Medications as of 06/30/2023  Medication Sig   albuterol (PROAIR HFA) 108 (90 Base) MCG/ACT inhaler Up to 2 puffs every 4 hours if needed   ALPRAZolam (XANAX) 0.25 MG tablet Take 0.25 mg by mouth 2 (two) times daily as needed for anxiety.   aspirin EC 81 MG tablet Take 81 mg by mouth daily.    azithromycin (ZITHROMAX) 250 MG tablet Take 1 tablet (250 mg total) by mouth daily for 6 doses. Take 500 mg on day 1, followed by 250 mg once daily on days 2 to 5   b complex vitamins capsule Take 1 capsule by mouth daily.    Biotin 19147 MCG TABS Take 1 capsule by mouth daily.   Budeson-Glycopyrrol-Formoterol (BREZTRI AEROSPHERE) 160-9-4.8 MCG/ACT AERO Inhale 2 puffs into the lungs 2 (two) times daily.   CALCIUM PO Take 1 tablet by mouth daily.   Cholecalciferol 25 MCG (1000 UT) tablet Take 1,000 Units by mouth daily.    fluticasone (FLONASE) 50 MCG/ACT nasal spray Place 1 spray into both nostrils daily as needed.    furosemide (LASIX) 20 MG tablet Take 10  mg by mouth daily as needed.   levalbuterol (XOPENEX) 0.63 MG/3ML nebulizer solution Take 3 mLs (0.63 mg total) by nebulization every 4 (four) hours as needed for wheezing or shortness of breath.   Omega-3 1000 MG CAPS Take 1 capsule by mouth 2 (two) times daily.    OXYGEN 3lpm with sleep and then as needed during the day  [facility]   pravastatin (PRAVACHOL) 20 MG tablet Take 20  mg by mouth daily.   predniSONE (DELTASONE) 10 MG tablet 4 tabs for 2 days, then 3 tabs for 2 days, 2 tabs for 2 days, then 1 tab for 2 days, then stop   predniSONE (DELTASONE) 10 MG tablet Take 6 tablets (60 mg total) by mouth daily with breakfast. Take 60 mg/day on day 1 and reduce dose by 10 mg every 2 days until taper is complete   sertraline (ZOLOFT) 50 MG tablet Take 50 mg by mouth daily.    telmisartan (MICARDIS) 80 MG tablet Take 1 tablet (80 mg total) by mouth daily.   TURMERIC PO Take 1 capsule by mouth daily.   No facility-administered encounter medications on file as of 06/30/2023.    Physical Exam: Blood pressure (!) 187/82, SpO2 90%. Gen:      No acute distress HEENT:  EOMI, sclera anicteric Neck:     No masses; no thyromegaly Lungs:    Diminished air entry CV:         Regular rate and rhythm; no murmurs Abd:      + bowel sounds; soft, non-tender; no palpable masses, no distension Ext:    No edema; adequate peripheral perfusion Skin:      Warm and dry; no rash Neuro: alert and oriented x 3 Psych: normal mood and affect  Data Reviewed: Imaging: Chest x-ray 07/07/2022-advanced emphysema, improving interstitial airspace disease.  I have reviewed the images personally.  PFTs: 06/17/2018 FEV1 0.59 [32%], FVC 1.17 [50%], F/F 50 Severe obstruction  Labs:  Assessment and Plan Chronic Obstructive Pulmonary Disease (COPD) Exacerbation Presents with a one-month history of worsening dyspnea, requiring increased oxygen use (3 L at rest, 4-5 L with activity). On prednisone since late January, with a recent dosage increase due to persistent symptoms. Reports light brown sputum, mild cough, and occasional wheezing. Physical exam reveals reduced air movement without significant wheezing. Exacerbation may be due to an underlying infection or other triggers. Discussed risks and benefits of increasing prednisone dosage and starting azithromycin. Prednisone can cause increased blood sugar  and infection risk, but benefits include reducing inflammation and improving breathing. Azithromycin chosen due to her history of pneumonia and to cover potential bacterial infection. No allergy to azithromycin but has had an adverse reaction to Levaquin.  - Order chest x-ray - Prescribe azithromycin (Z-Pak) - Increase prednisone to 60 mg daily and taper as directed - Continue Breztri inhaler, 2 puffs in the morning and evening - Continue nebulizer treatments with levalbuterol twice daily - Recommend over-the-counter Mucinex for phlegm management  Pneumonia Severe pneumonia in December 2019, complicated by a blood infection. Concerned about recurrence. Discussed the importance of ruling out pneumonia with a chest x-ray. - Order chest x-ray to rule out pneumonia  Follow-up - Follow up with Dr. Sherene Sires pulmonology based on x-ray results and symptom improvement.    Chilton Greathouse MD [facility] 06/30/2023, 1:36 PM  CC: Gordan Payment., MD

## 2023-06-30 NOTE — Patient Instructions (Addendum)
 VISIT SUMMARY:  Today, we discussed your worsening shortness of breath over the past month, which has affected your daily activities. We reviewed your current medications and recent changes, including your use of prednisone and home oxygen therapy. We also addressed your concerns about a possible recurrence of pneumonia.  YOUR PLAN:  -CHRONIC OBSTRUCTIVE PULMONARY DISEASE (COPD) EXACERBATION: COPD exacerbation means a worsening of your chronic lung condition, which can be triggered by infections or other factors. We will increase your prednisone to 60 mg daily and taper it as directed to reduce inflammation and improve your breathing. You will also start taking azithromycin (Z-Pak) to cover any potential bacterial infection. Continue using your Breztri inhaler, nebulizer treatments with levalbuterol, and consider using over-the-counter Mucinex to help manage phlegm.  -PNEUMONIA: Pneumonia is a lung infection that can cause severe symptoms. Given your history and current symptoms, we will order a chest x-ray to rule out pneumonia and ensure you receive appropriate treatment if needed.  INSTRUCTIONS:  Please get a chest x-ray as soon as possible to rule out pneumonia. Follow up with your pulmonologist Dr. Sherene Sires based on the x-ray results and how your symptoms improve.

## 2023-08-05 ENCOUNTER — Other Ambulatory Visit: Payer: Self-pay | Admitting: Internal Medicine

## 2023-08-05 MED ORDER — BREZTRI AEROSPHERE 160-9-4.8 MCG/ACT IN AERO: 2.0000 | INHALATION_SPRAY | Freq: Two times a day (BID) | RESPIRATORY_TRACT | 11 refills | Status: DC

## 2023-08-05 NOTE — Telephone Encounter (Signed)
 Copied from CRM [id number]. Topic: Clinical - Medication Refill >> Aug 05, 2023 10:36 AM Duncan Dull wrote: Most Recent Primary Care Visit: 12/15/2022  Medication: Budeson-Glycopyrrol-Formoterol (BREZTRI AEROSPHERE)   Has the patient contacted their pharmacy? Yes (Agent: If no, request that the patient contact the pharmacy for the refill. If patient does not wish to contact the pharmacy document the reason why and proceed with request.) (Agent: If yes, when and what did the pharmacy advise?)  Is this the correct pharmacy for this prescription? Yes If no, delete pharmacy and type the correct one.  This is the patient's preferred pharmacy:  [facility] [address] [facility] - [place], [place] - [address] [place] [address] Phone: [phone] Fax: [phone]   Has the prescription been filled recently? No  Is the patient out of the medication? Yes  Has the patient been seen for an appointment in the last year OR does the patient have an upcoming appointment? Yes  Can we respond through MyChart? Yes  Agent: Please be advised that Rx refills may take up to 3 business days. We ask that you follow-up with your pharmacy. Patient is running out of puffs, states that the pharmacy is out of refills for her and patient is out of puffs, would like to speak to a nurse, please call at [phone]

## 2023-08-06 NOTE — Telephone Encounter (Signed)
 Copied from CRM [id number]. Topic: Clinical - Prescription Issue >> Aug 05, 2023  9:48 AM Theodis Sato wrote: Reason for CRM: [facility] calling to request an update on the refill request that was sent on 3/20 for the patients Budeson-Glycopyrrol-Formoterol (BREZTRI AEROSPHERE) 160-9-4.8 MCG/ACT AERO - Prescription is expired and patient is out of this medication.   Already resolved

## 2023-08-11 ENCOUNTER — Telehealth: Payer: Self-pay

## 2023-08-11 NOTE — Telephone Encounter (Signed)
 Copied from CRM [id number]. Topic: Clinical - Medication Question >> Aug 11, 2023  2:42 PM Para March B wrote: Reason for CRM: Patient states she is in hospital and need Dr Sherene Sires to give her a prescription for Oxygen so she can be discharged home. Please call [phone] (cell)  Please advise oxygen order and liters for pt.

## 2023-08-11 NOTE — Telephone Encounter (Signed)
 I called and spoke to pt. Pt informed of Dr Rolin Barry note. Pt verbalized understanding. NFN

## 2023-08-11 NOTE — Telephone Encounter (Signed)
 After consulting with Zenon Mayo, CMA, she informed me that the patient would have to speak with her nurse or doctor at the hospital she is currently admitted to and have them put the orders in for portable oxygen since we are unable to coordinate with [facility].   I informed the patient of the information above. Patient verbalized understanding. I informed the patient to call us back with further questions or information. The nurse or the provider can call us with questions as well.

## 2023-08-11 NOTE — Telephone Encounter (Signed)
 Dischargin MD is iin charge of that decision

## 2023-08-18 ENCOUNTER — Other Ambulatory Visit: Payer: Self-pay | Admitting: Internal Medicine

## 2023-08-18 MED ORDER — LEVALBUTEROL HCL 0.63 MG/3ML IN NEBU: 0.6300 mg | INHALATION_SOLUTION | RESPIRATORY_TRACT | 11 refills | Status: DC | PRN

## 2023-08-18 NOTE — Telephone Encounter (Signed)
 Copied from CRM [id number]. Topic: Clinical - Medication Refill >> Aug 18, 2023  2:19 PM Isabell A wrote: Most Recent Primary Care Visit:   Medication: levalbuterol (XOPENEX) 0.63 MG/3ML nebulizer solution  Has the patient contacted their pharmacy? Yes (Agent: If no, request that the patient contact the pharmacy for the refill. If patient does not wish to contact the pharmacy document the reason why and proceed with request.) (Agent: If yes, when and what did the pharmacy advise?)  Is this the correct pharmacy for this prescription? Yes If no, delete pharmacy and type the correct one.  This is the patient's preferred pharmacy:  [facility] [address] [facility] - [place], [place] - [address] [place] [address] Phone: [phone] Fax: [phone]   Has the prescription been filled recently? Yes  Is the patient out of the medication? Yes  Has the patient been seen for an appointment in the last year OR does the patient have an upcoming appointment? Yes  Can we respond through MyChart? No  Agent: Please be advised that Rx refills may take up to 3 business days. We ask that you follow-up with your pharmacy.

## 2023-08-19 NOTE — Telephone Encounter (Signed)
 Copied from CRM [id number]. Topic: Clinical - Medication Refill >> Aug 18, 2023  2:19 PM Isabell A wrote: Most Recent Primary Care Visit:   Medication: levalbuterol (XOPENEX) 0.63 MG/3ML nebulizer solution  Has the patient contacted their pharmacy? Yes (Agent: If no, request that the patient contact the pharmacy for the refill. If patient does not wish to contact the pharmacy document the reason why and proceed with request.) (Agent: If yes, when and what did the pharmacy advise?)  Is this the correct pharmacy for this prescription? Yes If no, delete pharmacy and type the correct one.  This is the patient's preferred pharmacy:  [facility] [address] [facility] - [place], [place] - [address] [place] [address] Phone: [phone] Fax: [phone]   Has the prescription been filled recently? Yes  Is the patient out of the medication? Yes  Has the patient been seen for an appointment in the last year OR does the patient have an upcoming appointment? Yes  Can we respond through MyChart? No  Agent: Please be advised that Rx refills may take up to 3 business days. We ask that you follow-up with your pharmacy. >> Aug 18, 2023  3:20 PM Margarette Shawl wrote: Pharmacist with [facility] is calling in regards to request placed for levalbuterol (XOPENEX) 0.63 MG/3ML nebulizer solution. Advised by patient's son she is completely out of medication and is having more difficulty breathing. Asking for refill to be marked urgent if possible. Reviewed chart and advised request was sent to provider.   This has been sent into patient's pharmacy.Nothing else further needed.

## 2023-10-10 NOTE — Progress Notes (Unsigned)
 Rachel Evans, female    DOB: 02/03/1941     MRN: [id number]   Brief patient profile:  47  yowf  MM/quit smoking 2007 with doe to point where trouble steps but did not req 02 or meds but gradually worse and by  2015 rx= various inhalers and even 02 but didn't use consistently with doe = MMRC2   then acutely worse late Oct 2019 green sputum on trelegy rx levaquin > sore leg tendons so rx augmentin which helped mucus but breathing worse rx nebulizer xopenex  then admitted 04/26/18 with "pna/sepsis"@ [facility]  Floor bed > d/c May 03 2018 on IV ancef  which was complete Jan 15th 2020 and referred to pulmonary clinic 06/03/2018 by Dr   Authur Leghorn.with GOLD III criteria by spirometry 06/17/2018     History of Present Illness  06/03/2018  Pulmonary/ 1st office eval/Allan Bacigalupi  Chief Complaint  Patient presents with   Pulmonary Consult    Referred by Dr. Authur Leghorn. Pt states had PNA 04/26/18. She c/o SOB "sometimes"- occ gets winded walking room to room. She has occ cough with clear sputum.   Dyspnea:  MMRC3 = can't walk 100 yards even at a slow pace at a flat grade s stopping due to sob   Cough: clear /no am flares Sleep: on side hob on 1 pillow flat bed  SABA use: not using proair / has neb with levoalbuterol / back on trelgy now / a bit confused when when / how to use saba  02  None at rest/ 3lpm hs and 3 pulsed  out Typically sats are maint  low 90s at rest RA and upper 80s on POC rec Plan A = Automatic = Trelegy one click daily  Plan B = Backup Only use your albuterol (proair ) inhaler as a rescue medication  Work on inhaler technique:  Plan C = Crisis - only use your levoalbuterol nebulizer if you first try Plan B       01/19/2020  f/u ov/Carrson Lightcap re:  Copd III / 02 dep / trelegy maint  Chief Complaint  Patient presents with   Follow-up    COPD, SOB is worse  Dyspnea:  Sob 50 ft  Cough: no  Sleeping: bed flat, big pillow  SABA use: never prechallenges  02: 2-3 lpm and 4-5 with activity   rec For cough > mucinex 1200 mg every 12 hours as needed and use the flutter valve  Plan A = Automatic = Always=    Breztri  Take 2 puffs first thing in am and then another 2 puffs about 12 hours later.  Work on inhaler technique:  Prednisone  10 mg Take 4 for three days 3 for three days 2 for three days 1 for three days and stop  Plan B = Backup (to supplement plan A, not to replace it) Only use your albuterol  inhaler as a rescue medication Plan C = Crisis (instead of Plan B but only if Plan B stops working) - only use your albuterol  nebulizer if you first try Plan B and it fails to help > ok to use the nebulizer up to every 4 hours but if start needing it regularly call for immediate appointment   S/p Admit Theodis Fiscal for aecopd / did not follow action plan   07/07/2022  f/u ov/Morrie Daywalt re: GOLD 3/ 02 dep  maint on Trelegy 100  / no longer on prednisone   Chief Complaint  Patient presents with   Follow-up    Breathing is  baseline no sob  at rest. She uses xopenex  neb daily. She has noticed some occ swelling in her ankles since Jan 2024.   Dyspnea:  room to room at home  Cough: none but sensation of globus on trelegy with freq throat clearing  Sleeping: level one pillow with legs elevated > resolves edema  SABA use: neb 1st thing in am then Trelegy 100 02: 3lpm at rest and 4-5 lpm with activity  Covid status: vax max  Rec Plan A = Automatic = Always=    Trelegy 100 1st thing each am (Breztri  Take 2 puffs first thing in am and then another 2 puffs about 12 hours later.) Work on inhaler technique:  Plan B = Backup (to supplement plan A, not to replace it) Only use your albuterol  inhaler as a rescue medication Plan C = Crisis (instead of Plan B but only if Plan B stops working) - only use your levoalbuterol nebulizer if you first try Plan B Make sure you check your oxygen  saturation  AT  your highest level of activity (not after you stop)   to be sure it stays over 90%      12/15/2022 18m f/u  ov/Shantella Blubaugh re: GOLD 3 /02 dep    maint on Breztri  2bid/ /  02 dep and needs recert Chief Complaint  Patient presents with   Follow-up    Breathing slightly worse since the last visit. She is using neb about once per day.   Dyspnea:  room to room, losing ground, better p prednisone   Cough: none  Sleeping: flat bed / one pillow  SABA use: before leaving house/ never neb pre challenge but still avg once daily neb  02: 3lpm hs and sitting and then up  Rec Plan A = Automatic = Always=   Breztri  Take 2 puffs first thing in am and then another 2 puffs about 12 hours later.  Work on inhaler technique:   Plan B = Backup (to supplement plan A, not to replace it) Only use your albuterol  inhaler as a rescue medication  Plan C = Crisis (instead of Plan B but only if Plan B stops working) - only use your levoalbuterol nebulizer if you first try Plan B  Plan D= Deltasone  = Prednisone   - when ABC not working :  refillable Prednisone  10 mg take  4 each am x 2 days,   2 each am x 2 days,  1 each am x 2 days and stop  Make sure you check your oxygen  saturation  AT  your highest level of activity (not after you stop)   to be sure it stays over 90%     10/12/2023  f/u ov/Charlen Bakula re: GOLD 3 /02 dep    maint on BREZTRI    Chief Complaint  Patient presents with   Follow-up    COPD and Chronic Respiratory Failure with Hypoxia. Pt uses 4L O2 24/7  Dyspnea:  room to room s walker/ w/c to go out mostly to doctors  Cough: slt brownish in am  Sleeping: bed is slt raised electric s  resp cc  SABA use: Neb q am  02: 4lpm 24//7       No obvious day to day or daytime variability or assoc excess/ purulent sputum or mucus plugs or hemoptysis or cp or chest tightness, subjective wheeze or overt sinus or hb symptoms.    Also denies any obvious fluctuation of symptoms with weather or environmental changes or other aggravating or alleviating factors except as outlined above  No unusual exposure hx or h/o childhood pna/  asthma or knowledge of premature birth.  Current Allergies, Complete Past Medical History, Past Surgical History, Family History, and Social History were reviewed in [employer] record.  ROS  The following are not active complaints unless bolded Hoarseness, sore throat, dysphagia, dental problems, itching, sneezing,  nasal congestion or discharge of excess mucus or purulent secretions, ear ache,   fever, chills, sweats, unintended wt loss or wt gain, classically pleuritic or exertional cp,  orthopnea pnd or arm/hand swelling  or leg swelling, presyncope, palpitations, abdominal pain, anorexia, nausea, vomiting, diarrhea  or change in bowel habits or change in bladder habits, change in stools or change in urine, dysuria, hematuria,  rash, arthralgias, visual complaints, headache, numbness, weakness or ataxia or problems with walking or coordination,  change in mood or  memory.        Current Meds  Medication Sig   albuterol  (PROAIR  HFA) 108 (90 Base) MCG/ACT inhaler Up to 2 puffs every 4 hours if needed   aspirin EC 81 MG tablet Take 81 mg by mouth daily.    b complex vitamins capsule Take 1 capsule by mouth daily.    Biotin 16109 MCG TABS Take 1 capsule by mouth daily.   budeson-glycopyrrolate-formoterol (BREZTRI  AEROSPHERE) 160-9-4.8 MCG/ACT AERO Inhale 2 puffs into the lungs 2 (two) times daily.   CALCIUM PO Take 1 tablet by mouth daily.   Cholecalciferol 25 MCG (1000 UT) tablet Take 1,000 Units by mouth daily.    clonazePAM (KLONOPIN) 0.5 MG tablet Take 0.5 mg by mouth 2 (two) times daily as needed for anxiety.   fluticasone  (FLONASE) 50 MCG/ACT nasal spray Place 1 spray into both nostrils daily as needed.    furosemide (LASIX) 20 MG tablet Take 10 mg by mouth daily as needed.   levalbuterol  (XOPENEX ) 0.63 MG/3ML nebulizer solution Take 3 mLs (0.63 mg total) by nebulization every 4 (four) hours as needed for wheezing or shortness of breath.   Omega-3 1000 MG CAPS Take 1  capsule by mouth 2 (two) times daily.    OXYGEN  3lpm with sleep and then as needed during the day  [facility]   pravastatin (PRAVACHOL) 20 MG tablet Take 20 mg by mouth daily.   sertraline (ZOLOFT) 50 MG tablet Take 50 mg by mouth daily.    telmisartan  (MICARDIS ) 80 MG tablet Take 1 tablet (80 mg total) by mouth daily.   TURMERIC PO Take 1 capsule by mouth daily.           Objective:    Wts   10/12/2023         85  12/15/2022       86  07/07/2022         89  09/23/2021        96 03/19/2021      96   09/10/2020         93 03/12/2020        97  01/19/2020        96 09/12/2019        98 03/11/2019        99 10/07/2018          97   06/17/18 95 lb 12.8 oz (43.5 kg)  06/03/18 96 lb 9.6 oz (43.8 kg)  05/27/18 98 lb 9.6 oz (44.7 kg)     Vital signs reviewed  10/12/2023  - Note at rest 02 sats  99% on 3.5 LPM    General  appearance:    W/C ON ARRIVAL /  walking with rollator at home   HEENT :  Oropharynx  clear       NECK :  without JVD/Nodes/TM/ nl carotid upstrokes bilaterally   LUNGS: no acc muscle use,  Mod barrel  contour chest wall with bilateral  Distant bs s audible wheeze and  without cough on insp or exp maneuvers and mod  Hyperresonant  to  percussion bilaterally     CV:  RRR  no s3 or murmur or increase in P2, and trace edema both ankles despite elastic hose  ABD:  soft and nontender  MS:   Ext warm without deformities or   obvious joint restrictions , calf tenderness, cyanosis or clubbing  SKIN: warm and dry without lesions    NEURO:  alert, approp, nl sensorium with  no motor or cerebellar deficits apparent.           CXR PA and Lateral:   10/12/2023 :    I personally reviewed images and impression is as follows:     Bilateral infiltrates vs scarring LLL worse vs R side.        Assessment

## 2023-10-12 ENCOUNTER — Encounter: Payer: Self-pay | Admitting: Internal Medicine

## 2023-10-12 ENCOUNTER — Ambulatory Visit: Payer: PPO | Admitting: Pulmonary Disease

## 2023-10-12 ENCOUNTER — Ambulatory Visit (INDEPENDENT_AMBULATORY_CARE_PROVIDER_SITE_OTHER)

## 2023-10-12 ENCOUNTER — Ambulatory Visit: Payer: PPO | Admitting: Internal Medicine

## 2023-10-12 VITALS — BP 104/56 | HR 105 | Temp 97.9°F | Ht 60.0 in | Wt 85.8 lb

## 2023-10-12 DIAGNOSIS — J9611 Chronic respiratory failure with hypoxia: Secondary | ICD-10-CM

## 2023-10-12 DIAGNOSIS — J449 Chronic obstructive pulmonary disease, unspecified: Secondary | ICD-10-CM

## 2023-10-12 MED ORDER — AZITHROMYCIN 250 MG PO TABS: ORAL_TABLET | ORAL | 11 refills | Status: DC

## 2023-10-12 MED ORDER — PREDNISONE 10 MG PO TABS: ORAL_TABLET | ORAL | 11 refills | Status: DC

## 2023-10-12 NOTE — Patient Instructions (Addendum)
 Please remember to go to the  x-ray department  for your tests - we will call you with the results when they are available    For nasty mucus >  zpak if turns the mucus clear   If losing ground with breathing and needing more than twice daily neb then take Prednisone  10 mg take  4 each am x 2 days,   2 each am x 2 days,  1 each am x 2 days and stop    Please schedule a follow up visit in 12  months but call sooner if needed

## 2023-10-12 NOTE — Assessment & Plan Note (Addendum)
 Quit smoking 2007 - 02 dep 2015  - Alpha one AT screen 06/03/2018  MM level 160 - Spirometry 06/17/2018  FEV1 0.6 (32%)  Ratio 0.50  p trelegy in am/ classic curvature   - 10/07/2018  After extensive coaching inhaler device,  effectiveness =    90% with dpi / 75% hfa   - 01/19/2020  After extensive coaching inhaler device,  effectiveness =    50% (Ti too short)  > try breztri  2 pffs x 15 min p am neb saba > did not time as advised - 03/12/2020  After extensive coaching inhaler device,  effectiveness =    75% rechallnge with breztri  x 2 weeks and if not better back to trelegy but either way both are 1st thing in am as she doesn't get sob until starts her am activities  - 07/07/2022  After extensive coaching inhaler device,  effectiveness  < 25%( short ti, poor insp flow) > rec triple maint rx all per neb if covered  - 12/15/2022 added pred x 6 d as PLAN D - 10/12/2023 renewed pred as plan D and zpak prn purulent sputum - 10/12/2023  After extensive coaching inhaler device,  effectiveness =    75% continue Breztri     Group D (now reclassified as E) in terms of symptom/risk and laba/lama/ICS  therefore appropriate rx at this point >>>  breztri  plus appropriate SABA and pred as Plan D with omnicef prn purulent sputum

## 2023-10-12 NOTE — Assessment & Plan Note (Signed)
 02 dep since 2015   -  03/12/2020 :  Saturations on Room Air at Rest = 85%--increased to 92%5lpm pulsed o2 and walked 1 lap with sats and end 92% 5lpm pulsed.  - 12/15/2022 Patient Saturations on Room Air at Rest = 89% Patient Saturations on Room Air while Ambulating = 83% p 50 ft Patient Saturations on 4 Liters of pulsed oxygen  while Ambulating = 93%  Adequate control on present rx, reviewed in detail with pt > no change in rx needed       Each maintenance medication was reviewed in detail including emphasizing most importantly the difference between maintenance and prns and under what circumstances the prns are to be triggered using an action plan format where appropriate.  Total time for H and P, chart review, counseling, reviewing hfa/neb/02/pulse ox  device(s) and generating customized AVS unique to this office visit / same day charting = 32 min

## 2023-10-14 ENCOUNTER — Ambulatory Visit: Payer: Self-pay | Admitting: Internal Medicine

## 2023-11-15 NOTE — Progress Notes (Unsigned)
 @Patient  ID: Rachel Evans, female    DOB: August 17, 1940, 83 y.o.   MRN: [id number]  No chief complaint on file.   Referring provider: Thurmond Cathlyn LABOR., MD  HPI:   Brief patient profile:  24  yowf  MM/quit smoking 2007 with doe to point where trouble steps but did not req 02 or meds but gradually worse and by  2015 rx= various inhalers and even 02 but didn't use consistently with doe = MMRC2   then acutely worse late Oct 2019 green sputum on trelegy rx levaquin > sore leg tendons so rx augmentin which helped mucus but breathing worse rx nebulizer xopenex  then admitted 04/26/18 with pna/sepsis[facility]  Floor bed > d/c May 03 2018 on IV ancef  which was complete Jan 15th 2020 and referred to pulmonary clinic 06/03/2018 by Dr   Cathlyn Thurmond.with GOLD III criteria by spirometry 06/17/2018     History of Present Illness  06/03/2018  Pulmonary/ 1st office eval/[facility]  Chief Complaint  Patient presents with   Pulmonary Consult    Referred by Dr. Cathlyn Thurmond. Pt states had PNA 04/26/18. She c/o SOB sometimes- occ gets winded walking room to room. She has occ cough with clear sputum.   Dyspnea:  MMRC3 = can't walk 100 yards even at a slow pace at a flat grade s stopping due to sob   Cough: clear /no am flares Sleep: on side hob on 1 pillow flat bed  SABA use: not using proair / has neb with levoalbuterol / back on trelgy now / a bit confused when when / how to use saba  02  None at rest/ 3lpm hs and 3 pulsed  out Typically sats are maint  low 90s at rest RA and upper 80s on POC rec Plan A = Automatic = Trelegy one click daily  Plan B = Backup Only use your albuterol (proair ) inhaler as a rescue medication  Work on inhaler technique:  Plan C = Crisis - only use your levoalbuterol nebulizer if you first try Plan B       01/19/2020  f/u ov/[facility]ert re:  Copd III / 02 dep / trelegy maint  Chief Complaint  Patient presents with   Follow-up    COPD, SOB is worse  Dyspnea:  Sob 50 ft  Cough: no   Sleeping: bed flat, big pillow  SABA use: never prechallenges  02: 2-3 lpm and 4-5 with activity  rec For cough > mucinex 1200 mg every 12 hours as needed and use the flutter valve  Plan A = Automatic = Always=    Breztri  Take 2 puffs first thing in am and then another 2 puffs about 12 hours later.  Work on inhaler technique:  Prednisone  10 mg Take 4 for three days 3 for three days 2 for three days 1 for three days and stop  Plan B = Backup (to supplement plan A, not to replace it) Only use your albuterol  inhaler as a rescue medication Plan C = Crisis (instead of Plan B but only if Plan B stops working) - only use your albuterol  nebulizer if you first try Plan B and it fails to help > ok to use the nebulizer up to every 4 hours but if start needing it regularly call for immediate appointment   S/p Admit [facility] for aecopd / did not follow action plan   07/07/2022  f/u ov/Wert re: GOLD 3/ 02 dep  maint on Trelegy 100  / no longer on prednisone   Chief Complaint  Patient presents with   Follow-up    Breathing is  baseline no sob at rest. She uses xopenex  neb daily. She has noticed some occ swelling in her ankles since Jan 2024.   Dyspnea:  room to room at home  Cough: none but sensation of globus on trelegy with freq throat clearing  Sleeping: level one pillow with legs elevated > resolves edema  SABA use: neb 1st thing in am then Trelegy 100 02: 3lpm at rest and 4-5 lpm with activity  Covid status: vax max    No obvious day to day or daytime variability or assoc excess/ purulent sputum or mucus plugs or hemoptysis or cp or chest tightness, subjective wheeze or overt sinus or hb symptoms.   Sleepoing  without nocturnal  or early am exacerbation  of respiratory  c/o's or need for noct saba. Also denies any obvious fluctuation of symptoms with weather or environmental changes or other aggravating or alleviating factors except as outlined above   No unusual exposure hx or h/o childhood pna/  asthma or knowledge of premature birth.  Current Allergies, Complete Past Medical History, Past Surgical History, Family History, and Social History were reviewed in [employer] record.  ROS  The following are not active complaints unless bolded Hoarseness, sore throat, dysphagia, dental problems, itching, sneezing,  nasal congestion or discharge of excess mucus or purulent secretions, ear ache,   fever, chills, sweats, unintended wt loss or wt gain, classically pleuritic or exertional cp,  orthopnea pnd or arm/hand swelling  or leg swelling, presyncope, palpitations, abdominal pain, anorexia, nausea, vomiting, diarrhea  or change in bowel habits or change in bladder habits, change in stools or change in urine, dysuria, hematuria,  rash, arthralgias, visual complaints, headache, numbness, weakness or ataxia or problems with walking or coordination,  change in mood or  memory.             Allergies  Allergen Reactions   Levofloxacin Other (See Comments)    Leg pain and hardness    Immunization History  Administered Date(s) Administered   Fluad Quad(high Dose 65+) 02/18/2021, 02/06/2022   Influenza, High Dose Seasonal PF 02/05/2020   Influenza, Quadrivalent, Recombinant, Inj, Pf 12/31/2018   Influenza,inj,Quad PF,6+ Mos 01/16/2015   Influenza-Unspecified 02/06/2017, 02/04/2018   Pfizer(Comirnaty)Fall Seasonal Vaccine 12 years and older 01/15/2023   Pneumococcal Conjugate-13 03/05/2013   Pneumococcal Polysaccharide-23 05/06/2007, 11/27/2016   Tdap 03/09/2023   Tetanus 05/06/2007   Zoster Recombinant(Shingrix) 11/06/2016   Zoster, Live 05/06/2007    Past Medical History:  Diagnosis Date   Anxiety    Arthritis    Cancer (HCC)    colon cancer 2007   Cataract    Chest pain 12/03/2016   Chronic obstructive pulmon disease w acute lower resp infct (HCC) 11/22/2015   Chodri.   Chronic respiratory failure with hypoxia (HCC) 06/04/2018   02 dep since 2015 though  still not needing at rest as recently as 04/2018    COPD  pfts pending 06/03/2018   Quit smoking 2007 - 02 dep 2015  - 06/03/2018  After extensive coaching inhaler device,  effectiveness =   75% - alpha one AT screen 06/03/2018      COPD (chronic obstructive pulmonary disease) (HCC)    patient diagnosed in 2013   COPD exacerbation (HCC) 05/10/2018   Essential hypertension 11/22/2015   Mild permissive htn.  Orthostatic hypotension can occur.   Hyperlipidemia 12/03/2016   Hypertension    Malaise and  fatigue 11/22/2015   Mitral valve disorder 06/11/2018   Pneumonia    Right thyroid  nodule 06/11/2018   Sepsis (HCC)    SOB (shortness of breath) 12/03/2016   TIA (transient ischemic attack)     Tobacco History: Social History   Tobacco Use  Smoking Status Former   Current packs/day: 0.00   Average packs/day: 1.5 packs/day for 40.0 years (60.0 ttl pk-yrs)   Types: Cigarettes   Start date: 05/18/1965   Quit date: 05/18/2005   Years since quitting: 18.5  Smokeless Tobacco Never   Counseling given: Not Answered   Outpatient Medications Prior to Visit  Medication Sig Dispense Refill   albuterol  (PROAIR  HFA) 108 (90 Base) MCG/ACT inhaler Up to 2 puffs every 4 hours if needed 6.7 g 3   aspirin EC 81 MG tablet Take 81 mg by mouth daily.      azithromycin  (ZITHROMAX ) 250 MG tablet Take 2 on day one then 1 daily x 4 days 6 tablet 11   b complex vitamins capsule Take 1 capsule by mouth daily.      Biotin 89999 MCG TABS Take 1 capsule by mouth daily.     budeson-glycopyrrolate-formoterol (BREZTRI  AEROSPHERE) 160-9-4.8 MCG/ACT AERO Inhale 2 puffs into the lungs 2 (two) times daily. 10.7 g 11   CALCIUM PO Take 1 tablet by mouth daily.     Cholecalciferol 25 MCG (1000 UT) tablet Take 1,000 Units by mouth daily.      clonazePAM (KLONOPIN) 0.5 MG tablet Take 0.5 mg by mouth 2 (two) times daily as needed for anxiety.     fluticasone  (FLONASE) 50 MCG/ACT nasal spray Place 1 spray into both nostrils daily as  needed.      furosemide (LASIX) 20 MG tablet Take 10 mg by mouth daily as needed.     levalbuterol  (XOPENEX ) 0.63 MG/3ML nebulizer solution Take 3 mLs (0.63 mg total) by nebulization every 4 (four) hours as needed for wheezing or shortness of breath. 120 mL 11   Omega-3 1000 MG CAPS Take 1 capsule by mouth 2 (two) times daily.      OXYGEN  3lpm with sleep and then as needed during the day  [employer]     pravastatin (PRAVACHOL) 20 MG tablet Take 20 mg by mouth daily.     predniSONE  (DELTASONE ) 10 MG tablet 4 tabs for 2 days, then 3 tabs for 2 days, 2 tabs for 2 days, then 1 tab for 2 days, then stop 20 tablet 11   sertraline (ZOLOFT) 50 MG tablet Take 50 mg by mouth daily.      telmisartan  (MICARDIS ) 80 MG tablet Take 1 tablet (80 mg total) by mouth daily. 30 tablet 11   TURMERIC PO Take 1 capsule by mouth daily.     No facility-administered medications prior to visit.      Review of Systems  Review of Systems   Physical Exam  There were no vitals taken for this visit. Physical Exam   Lab Results:  CBC    Component Value Date/Time   WBC 9.5 03/11/2019 1442   RBC 4.35 03/11/2019 1442   HGB 14.4 03/11/2019 1442   HCT 42.4 03/11/2019 1442   PLT 264.0 03/11/2019 1442   MCV 97.6 03/11/2019 1442   MCHC 33.9 03/11/2019 1442   RDW 13.8 03/11/2019 1442   LYMPHSABS 0.6 (L) 03/11/2019 1442   MONOABS 0.4 03/11/2019 1442   EOSABS 0.0 03/11/2019 1442   BASOSABS 0.0 03/11/2019 1442    BMET    Component Value  Date/Time   NA 134 (L) 04/05/2019 1230   K 3.7 04/05/2019 1230   CL 99 04/05/2019 1230   CO2 27 04/05/2019 1230   GLUCOSE 95 04/05/2019 1230   BUN 14 04/05/2019 1230   CREATININE 0.58 04/05/2019 1230   CALCIUM 9.0 04/05/2019 1230    BNP No results found for: BNP  ProBNP No results found for: PROBNP  Imaging: No results found.   Assessment & Plan:   No problem-specific Assessment & Plan notes found for this encounter.     Almarie LELON Ferrari,  NP 11/15/2023

## 2023-11-16 ENCOUNTER — Ambulatory Visit: Admitting: Primary Care

## 2023-11-16 ENCOUNTER — Encounter: Payer: Self-pay | Admitting: Primary Care

## 2023-11-16 VITALS — BP 136/80 | HR 80 | Temp 97.8°F | Ht 60.0 in | Wt 86.8 lb

## 2023-11-16 DIAGNOSIS — J9611 Chronic respiratory failure with hypoxia: Secondary | ICD-10-CM

## 2023-11-16 DIAGNOSIS — J189 Pneumonia, unspecified organism: Secondary | ICD-10-CM

## 2023-11-16 DIAGNOSIS — J449 Chronic obstructive pulmonary disease, unspecified: Secondary | ICD-10-CM | POA: Diagnosis not present

## 2023-11-16 NOTE — Patient Instructions (Signed)
  VISIT SUMMARY: You came in today for a follow-up visit after being treated for bronchitis symptoms. You have a history of COPD and chronic respiratory failure, and you were previously prescribed a Z-Pak. Your condition has improved, and there are no current signs of pneumonia or exacerbation of your COPD.  YOUR PLAN: -CHRONIC OBSTRUCTIVE PULMONARY DISEASE (COPD): COPD is a chronic lung disease that makes it hard to breathe. You should continue using Breztri  as prescribed, maintain your oxygen  therapy at 4 liters per minute continuously, and use your nebulizer as needed for shortness of breath. You do not need to take prednisone  unless you experience a flare-up or a change in mucus color.  -CHRONIC RESPIRATORY FAILURE: Chronic respiratory failure occurs when your lungs can't get enough oxygen  into your blood. This is managed with continuous oxygen  therapy. You should continue your oxygen  therapy at 4 liters per minute continuously.  -PNEUMONIA: Pneumonia is an infection that inflames the air sacs in one or both lungs. Your previous chest x-ray showed a possible sign of pneumonia, but you have no current symptoms. We will order CT scan of your chest to get a better image left lung   INSTRUCTIONS: Please schedule a CT scan at [facility]. Continue with your current medications and oxygen  therapy as discussed. Follow up with us  if you experience any new or worsening symptoms.  Follow-up 6 months with Dr. Darlean or sooner if needed

## 2023-11-20 ENCOUNTER — Ambulatory Visit (HOSPITAL_BASED_OUTPATIENT_CLINIC_OR_DEPARTMENT_OTHER)
Admission: RE | Admit: 2023-11-20 | Discharge: 2023-11-20 | Disposition: A | Discharge status: Home / Self Care | Source: Ambulatory Visit | Attending: Primary Care | Admitting: Primary Care

## 2023-11-20 DIAGNOSIS — J189 Pneumonia, unspecified organism: Secondary | ICD-10-CM

## 2023-11-30 ENCOUNTER — Ambulatory Visit: Payer: Self-pay | Admitting: Primary Care

## 2023-11-30 DIAGNOSIS — R911 Solitary pulmonary nodule: Secondary | ICD-10-CM

## 2023-12-03 NOTE — Progress Notes (Signed)
 I called and spoke to pt. Pt was informed of Beth's note and verbalized understanding. Pt requested that this would be done in [place] for her. I informed pt that we could and to make sure she schedules a month f/u with Beth once she knows when the PET CT is scheduled. Pt verbalized understanding. PET CT scheduled. NFN

## 2023-12-08 ENCOUNTER — Telehealth: Payer: Self-pay | Admitting: Primary Care

## 2023-12-09 ENCOUNTER — Telehealth: Payer: Self-pay

## 2023-12-09 NOTE — Telephone Encounter (Signed)
 Patient scheduled nfn.

## 2023-12-09 NOTE — Telephone Encounter (Signed)
 Copied from CRM [id number]. Topic: Clinical - Request for Lab/Test Order >> Dec 08, 2023 11:10 AM Leila BROCKS wrote: Reason for CRM: Patient [phone] states PET scan cannot be done at the [facility] until after 01/18/24, patient asked if she needs to wait or go somewhere else? Please advise and call back.    Old message already scheduled    NFN-

## 2023-12-15 ENCOUNTER — Encounter (HOSPITAL_COMMUNITY)
Admission: RE | Admit: 2023-12-15 | Discharge: 2023-12-15 | Disposition: A | Discharge status: Home / Self Care | Source: Ambulatory Visit | Attending: Primary Care

## 2023-12-15 ENCOUNTER — Encounter (HOSPITAL_COMMUNITY)
Admission: RE | Admit: 2023-12-15 | Discharge: 2023-12-15 | Disposition: A | Discharge status: Home / Self Care | Source: Ambulatory Visit | Attending: Primary Care | Admitting: Primary Care

## 2023-12-15 DIAGNOSIS — R911 Solitary pulmonary nodule: Secondary | ICD-10-CM

## 2023-12-15 LAB — GLUCOSE, CAPILLARY: Glucose-Capillary: 84 mg/dL (ref 70–99)

## 2023-12-15 MED ORDER — FLUDEOXYGLUCOSE F - 18 (FDG) INJECTION
4.9000 | Freq: Once | INTRAVENOUS | Status: AC | PRN
  Administered 2023-12-15 (×2): 4.9 via INTRAVENOUS

## 2023-12-30 NOTE — Telephone Encounter (Signed)
 Error/disregard

## 2023-12-31 ENCOUNTER — Other Ambulatory Visit: Payer: Self-pay

## 2023-12-31 DIAGNOSIS — J9611 Chronic respiratory failure with hypoxia: Secondary | ICD-10-CM

## 2023-12-31 DIAGNOSIS — J449 Chronic obstructive pulmonary disease, unspecified: Secondary | ICD-10-CM

## 2024-01-19 NOTE — Telephone Encounter (Signed)
**Note De-identified  [place] Obfuscation** Please advise 

## 2024-01-20 ENCOUNTER — Telehealth: Payer: Self-pay | Admitting: Primary Care

## 2024-01-20 ENCOUNTER — Ambulatory Visit: Payer: Self-pay | Admitting: Primary Care

## 2024-01-20 DIAGNOSIS — R911 Solitary pulmonary nodule: Secondary | ICD-10-CM

## 2024-01-20 NOTE — Telephone Encounter (Signed)
 I called patient to review PET scan, she would feel more comfortable to get repeat imaging in 6 months but she is agreeing to a biopsy if needed. I have sent a staff message to both Dr. Lambert cc: Dr. Darlean to review imaging.

## 2024-01-21 ENCOUNTER — Telehealth: Payer: Self-pay | Admitting: Primary Care

## 2024-01-21 NOTE — Telephone Encounter (Signed)
 We have ordered repeat imaging for 3 months. See previous note, patient would prefer to conservatively manage right now but if nodule continues to grow and bronchoscopy is needed she would be open to tissue sampling

## 2024-01-21 NOTE — Telephone Encounter (Signed)
 ATC X1. LMTCB. Super D CT has been ordered.

## 2024-01-21 NOTE — Telephone Encounter (Signed)
-----   Message from Lamar GORMAN Chris sent at 01/21/2024  9:19 AM EDT ----- Regarding: RE: left lower lobe pulmonary nodule I looked at it - definitely a concerning lesion, but I'd be hesitant to do bronch on 4L/min. I think I would repeat the CT in 3 months. Depending on what it does we can decide on bronch vs referral to consider empiric XRT - maybe even repeat her PET if it grows. ----- Message ----- From: [name], NP Sent: 01/20/2024   5:24 PM EDT To: Ozell KATHEE America, MD; Lamar GORMAN Chris, MD Subject: left lower lobe pulmonary nodule               Hi Dr. Chris- Can you review this patients recent PET scan. This is an 83 year old female with COPD and chronic respiratory failure, requiring continuous oxygen  therapy at four liters. I saw this patient in July for a follow-up from June where she was treated for bronchitis by Dr. America. Symptoms included a cough with brown mucus. She completed Z-Pak but never took prednisone  as mucus color cleared.  A CT was obtained due to persistent infiltrate on chest imaging. CT chest on July 18th showed nodular area in the left lower lobe medially measures 1.7 x 1 cm and appears more prominent than on prior study.  PET CT on August 12th showed that the enlarging left lower lobe pulmonary nodule demonstrates low level metabolic activity, potentially reflecting low-grade adenocarcinoma. Differential includes postinflammatory scarring. Consider tissue sampling or follow-up chest CT in 6 months. No evidence of metastatic disease or other suspicious nodularity.  Can you review imaging, do you want me to schedule her for a bronchoscopy or repeating imaging in 6 months. I have not called patient with theses results.   -Beth

## 2024-01-21 NOTE — Telephone Encounter (Signed)
 Thanks - agree with repeat Ct in 3-6 months, then decide next steps

## 2024-01-21 NOTE — Telephone Encounter (Signed)
 Thanks, Rachel Evans can we relay this message to Ms. Dipierro and please order repeat Super D-CT chest in 3-6 months  I have already given her PET results yesterday

## 2024-01-22 NOTE — Telephone Encounter (Signed)
 I called and spoke to pt. Pt informed of Beth's note and verbalized understanding. NFN

## 2024-02-29 ENCOUNTER — Ambulatory Visit: Admitting: Internal Medicine

## 2024-02-29 ENCOUNTER — Ambulatory Visit

## 2024-02-29 ENCOUNTER — Encounter: Payer: Self-pay | Admitting: Internal Medicine

## 2024-02-29 ENCOUNTER — Ambulatory Visit: Payer: Self-pay | Admitting: Internal Medicine

## 2024-02-29 VITALS — BP 118/76 | HR 71 | Temp 97.8°F | Ht 60.0 in

## 2024-02-29 DIAGNOSIS — R1310 Dysphagia, unspecified: Secondary | ICD-10-CM

## 2024-02-29 DIAGNOSIS — J449 Chronic obstructive pulmonary disease, unspecified: Secondary | ICD-10-CM

## 2024-02-29 DIAGNOSIS — J9611 Chronic respiratory failure with hypoxia: Secondary | ICD-10-CM | POA: Diagnosis not present

## 2024-02-29 MED ORDER — PANTOPRAZOLE SODIUM 40 MG PO TBEC: 40.0000 mg | DELAYED_RELEASE_TABLET | Freq: Every day | ORAL | 2 refills | Status: DC

## 2024-02-29 MED ORDER — CLOTRIMAZOLE 10 MG MT TROC: 10.0000 mg | Freq: Every day | OROMUCOSAL | 2 refills | Status: DC

## 2024-02-29 MED ORDER — FAMOTIDINE 20 MG PO TABS: ORAL_TABLET | ORAL | 11 refills | Status: DC

## 2024-02-29 NOTE — Telephone Encounter (Signed)
 Has appt today

## 2024-02-29 NOTE — Telephone Encounter (Signed)
 FYI Only or Action Required?: FYI only for provider.  Patient is followed in Pulmonology for COPD, last seen on 11/16/2023 by Rachel Evans ORN, NP.  Called Nurse Triage reporting Cough.  Symptoms began several days ago.  Interventions attempted: OTC medications: salt water rinse.  Symptoms are: unchanged.  Triage Disposition: See PCP When Office is Open (Within 3 Days)  Patient/caregiver understands and will follow disposition?: Yes  E2C2 Pulmonary Triage - Initial Assessment Questions "Chief Complaint (e.g., cough, sob, wheezing, fever, chills, sweat or additional symptoms) *Go to specific symptom protocol after initial questions. Sore throat/phlegm   "How long have symptoms been present?" Since friday   MEDICINES:   "Have you used any OTC meds to help with symptoms?" Yes If yes, ask "What medications?" Salt water rinse    OXYGEN : "Do you wear supplemental oxygen ?" Yes If yes, "How many liters are you supposed to use?" 4L   Copied from CRM #[id number]. Topic: Clinical - Red Word Triage >> Feb 29, 2024 10:12 AM Rachel Evans wrote: Red Word that prompted transfer to Nurse Triage: Sore throat, and coughed up mucus that was all colors patient states she has never seen this before. Reason for Disposition  [1] Sore throat is the only symptom AND [2] present > 48 hours  Answer Assessment - Initial Assessment Questions Thursday/Friday she started to feel like her meds were harder to swallow and then Saturday she developed Sore throat. No Strep exposure that she is aware of. She denies unilateral weakness. Speech is clear and confirmed normal. Patient got pills stuck and while coughing, multicolored phlegm came out. Denies frank or rust colored phlegm. States it was more tan dark brown with yellow, green. She has been gargling salt water to keep the area soothed and clean.   3 months ago dx with a lung nodule in [place]. Scan in December to check growth. ED/UC precautions given and  understood. Pt to call EMS to evaluate if questionable for bloody sputum. Pt will call back if she is unable to make Appt this afternoon.   1. ONSET: When did the throat start hurting? (Hours or days ago)      Friday  2. SEVERITY: How bad is the sore throat? (Scale 1-10; mild, moderate or severe)     mild 3. STREP EXPOSURE: Has there been any exposure to strep within the past week? If Yes, ask: What type of contact occurred?      denies 4.  VIRAL SYMPTOMS: Are there any symptoms of a cold, such as a runny nose, cough, hoarse voice or red eyes?      Mild cough  5. FEVER: Do you have a fever? If Yes, ask: What is your temperature, how was it measured, and when did it start?     denies 6. PUS ON THE TONSILS: Is there pus on the tonsils in the back of your throat?     Denies looking 7. OTHER SYMPTOMS: Do you have any other symptoms? (e.g., difficulty breathing, headache, rash)     No more than normal  Protocols used: Sore Throat-A-AH

## 2024-02-29 NOTE — Assessment & Plan Note (Addendum)
 Quit smoking 2007 - 02 dep 2015  - Alpha one AT screen 06/03/2018  MM level 160 - Spirometry 06/17/2018  FEV1 0.6 (32%)  Ratio 0.50  p trelegy in am/ classic curvature   - 10/07/2018  After extensive coaching inhaler device,  effectiveness =    90% with dpi / 75% hfa   - 01/19/2020  After extensive coaching inhaler device,  effectiveness =    50% (Ti too short)  > try breztri  2 pffs x 15 min p am neb saba > did not time as advised - 03/12/2020  After extensive coaching inhaler device,  effectiveness =    75% rechallnge with breztri  x 2 weeks and if not better back to trelegy but either way both are 1st thing in am as she doesn't get sob until starts her am activities  - 07/07/2022  After extensive coaching inhaler device,  effectiveness  < 25%( short ti, poor insp flow) > rec triple maint rx all per neb if covered  - 12/15/2022 added pred x 6 d as PLAN D - 10/12/2023 renewed pred as plan D and zpak prn purulent sputum   Mild aecopd likely related to dysphgia and perhaps mild aspiration though lungs are clear bilaterally, no def new infiltrates and sats are fine on lower than nl 02 requirements so rec  Rx dysphgia/ esophagitis with combination of clotrimazole troche/ PPI and h2 hs and f/u with Landry Ferrari NP as planned

## 2024-02-29 NOTE — Progress Notes (Signed)
 Rachel Evans, female    DOB: [birth year]     MRN: [id number]   Brief patient profile:  82  yowf  MM/quit smoking 2007 with doe to point where trouble steps but did not req 02 or meds but gradually worse and by  2015 rx= various inhalers and even 02 but didn't use consistently with doe = MMRC2   then acutely worse late Oct 2019 green sputum on trelegy rx levaquin > sore leg tendons so rx augmentin which helped mucus but breathing worse rx nebulizer xopenex  then admitted 04/26/18 with pna/sepsis@ [facility]  Floor bed > d/c May 03 2018 on IV ancef  which was complete Jan 15th 2020 and referred to pulmonary clinic 06/03/2018 by Dr   Rachel Evans.with GOLD III criteria by spirometry 06/17/2018     History of Present Illness  06/03/2018  Pulmonary/ 1st office eval/Rachel Evans  Chief Complaint  Patient presents with   Pulmonary Consult    Referred by Dr. Cathlyn Evans. Pt states had PNA 04/26/18. She c/o SOB sometimes- occ gets winded walking room to room. She has occ cough with clear sputum.   Dyspnea:  MMRC3 = can't walk 100 yards even at a slow pace at a flat grade s stopping due to sob   Cough: clear /no am flares Sleep: on side hob on 1 pillow flat bed  SABA use: not using proair / has neb with levoalbuterol / back on trelgy now / a bit confused when when / how to use saba  02  None at rest/ 3lpm hs and 3 pulsed  out Typically sats are maint  low 90s at rest RA and upper 80s on POC rec Plan A = Automatic = Trelegy one click daily  Plan B = Backup Only use your albuterol (proair ) inhaler as a rescue medication  Work on inhaler technique:  Plan C = Crisis - only use your levoalbuterol nebulizer if you first try Plan B    12/15/2022 43m f/u ov/Rachel Evans re: GOLD 3 /02 dep    maint on Breztri  2bid/ /  02 dep and needs recert Chief Complaint  Patient presents with   Follow-up    Breathing slightly worse since the last visit. She is using neb about once per day.   Dyspnea:  room to room, losing ground,  better p prednisone   Cough: none  Sleeping: flat bed / one pillow  SABA use: before leaving house/ never neb pre challenge but still avg once daily neb  02: 3lpm hs and sitting and then up  Rec Plan A = Automatic = Always=   Breztri  Take 2 puffs first thing in am and then another 2 puffs about 12 hours later.  Work on inhaler technique:   Plan B = Backup (to supplement plan A, not to replace it) Only use your albuterol  inhaler as a rescue medication  Plan C = Crisis (instead of Plan B but only if Plan B stops working) - only use your levoalbuterol nebulizer if you first try Plan B  Plan D= Deltasone  = Prednisone   - when ABC not working :  refillable Prednisone  10 mg take  4 each am x 2 days,   2 each am x 2 days,  1 each am x 2 days and stop  Make sure you check your oxygen  saturation  AT  your highest level of activity (not after you stop)   to be sure it stays over 90%     10/12/2023  f/u ov/Rachel Evans re: GOLD 3 /02  dep    maint on BREZTRI    Chief Complaint  Patient presents with   Follow-up    COPD and Chronic Respiratory Failure with Hypoxia. Pt uses 4L O2 24/7  Dyspnea:  room to room s walker/ w/c to go out mostly to doctors  Cough: slt brownish in am  Sleeping: bed is slt raised electric s  resp cc  SABA use: Neb q am  02: 4lpm 24//7  Rec For nasty mucus >  zpak if turns the mucus clear  If losing ground with breathing and needing more than twice daily neb then take Prednisone  10 mg take  4 each am x 2 days,   2 each am x 2 days,  1 each am x 2 days and stop        02/29/2024  ACUTE  ov/Rachel Evans re: dyphagia/ cough    maint on breztri   Oct 23 dysphagia  Chief Complaint  Patient presents with   Acute Visit    Sore throat and difficulty swallowing over the past 4-5 days. She had some cough this morning with large amounts of brown to white sputum.    Dyspnea:  comforable at rest / mostly room to room  Cough: none now but p dysphagia gagged and ? Vomited mucus  Sleeping: slt raised   hob one big pillow s  resp cc  SABA use: rarely  02: 4lpm     No obvious day to day or daytime variability or assoc  mucus plugs or hemoptysis or cp or chest tightness, subjective wheeze or overt sinus or hb symptoms.    Also denies any obvious fluctuation of symptoms with weather or environmental changes or other aggravating or alleviating factors except as outlined above   No unusual exposure hx or h/o childhood pna/ asthma or knowledge of premature birth.  Current Allergies, Complete Past Medical History, Past Surgical History, Family History, and Social History were reviewed in [employer] record.  ROS  The following are not active complaints unless bolded Hoarseness, sore throat, dysphagia/globus, dental problems, itching, sneezing,  nasal congestion or discharge of excess mucus or purulent secretions, ear ache,   fever, chills, sweats, unintended wt loss or wt gain, classically pleuritic or exertional cp,  orthopnea pnd or arm/hand swelling  or leg swelling, presyncope, palpitations, abdominal pain, anorexia, nausea, vomiting, diarrhea  or change in bowel habits or change in bladder habits, change in stools or change in urine, dysuria, hematuria,  rash, arthralgias, visual complaints, headache, numbness, weakness or ataxia or problems with walking or coordination,  change in mood or  memory.        Current Meds  Medication Sig   albuterol  (PROAIR  HFA) 108 (90 Base) MCG/ACT inhaler Up to 2 puffs every 4 hours if needed   aspirin EC 81 MG tablet Take 81 mg by mouth daily.    b complex vitamins capsule Take 1 capsule by mouth daily.    Biotin 89999 MCG TABS Take 1 capsule by mouth daily.   budeson-glycopyrrolate-formoterol (BREZTRI  AEROSPHERE) 160-9-4.8 MCG/ACT AERO Inhale 2 puffs into the lungs 2 (two) times daily.   CALCIUM PO Take 1 tablet by mouth daily.   Cholecalciferol 25 MCG (1000 UT) tablet Take 1,000 Units by mouth daily.    clonazePAM (KLONOPIN) 0.5 MG  tablet Take 0.5 mg by mouth 2 (two) times daily as needed for anxiety.   fluticasone  (FLONASE) 50 MCG/ACT nasal spray Place 1 spray into both nostrils daily as needed.    furosemide (  LASIX) 20 MG tablet Take 10 mg by mouth daily as needed.   levalbuterol  (XOPENEX ) 0.63 MG/3ML nebulizer solution Take 3 mLs (0.63 mg total) by nebulization every 4 (four) hours as needed for wheezing or shortness of breath.   Omega-3 1000 MG CAPS Take 1 capsule by mouth 2 (two) times daily.    OXYGEN  3lpm with sleep and then as needed during the day  [employer]   pravastatin (PRAVACHOL) 20 MG tablet Take 20 mg by mouth daily.   sertraline (ZOLOFT) 50 MG tablet Take 50 mg by mouth daily.    telmisartan  (MICARDIS ) 80 MG tablet Take 1 tablet (80 mg total) by mouth daily.   TURMERIC PO Take 1 capsule by mouth daily.             Objective:    Wts   02/29/2024     in w/c, declined wt 10/12/2023         85  12/15/2022       86  07/07/2022         89  09/23/2021        96 03/19/2021      96   09/10/2020         93 03/12/2020        97  01/19/2020        96 09/12/2019        98 03/11/2019        99 10/07/2018          97   06/17/18 95 lb 12.8 oz (43.5 kg)  06/03/18 96 lb 9.6 oz (43.8 kg)  05/27/18 98 lb 9.6 oz (44.7 kg)   Vital signs reviewed  02/29/2024  - Note at rest 02 sats  98% on 2.5 lpm   General appearance:    w/c bound  frail elderly wf/ good phonation, no increased wob    HEENT :  Oropharynx  pos thrus  Nasal turbinates nl    NECK :  without JVD/Nodes/TM/ nl carotid upstrokes bilaterally   LUNGS: no acc muscle use,  Mod barrel  contour chest wall with bilateral  Distant bs s audible wheeze and  without cough on insp or exp maneuvers and mod  Hyperresonant  to  percussion bilaterally     CV:  RRR  no s3 or murmur or increase in P2, and no edema   ABD:  soft and nontender with pos mid insp Hoover's  in the supine position. No bruits or organomegaly appreciated, bowel sounds nl  MS:   Ext warm  without deformities or   obvious joint restrictions , calf tenderness, cyanosis or clubbing  SKIN: warm and dry without lesions    NEURO:  alert, approp, nl sensorium with  no motor or cerebellar deficits apparent.          CXR PA and Lateral:   02/29/2024 :    I personally reviewed images and impression is as follows:    No acute findings    Has PET super D Ct scheduled for 04/11/24  Assessment   Assessment & Plan Odynophagia  COPD  GOLD III Quit smoking 2007 - 02 dep 2015  - Alpha one AT screen 06/03/2018  MM level 160 - Spirometry 06/17/2018  FEV1 0.6 (32%)  Ratio 0.50  p trelegy in am/ classic curvature   - 10/07/2018  After extensive coaching inhaler device,  effectiveness =    90% with dpi / 75% hfa   - 01/19/2020  After extensive coaching  inhaler device,  effectiveness =    50% (Ti too short)  > try breztri  2 pffs x 15 min p am neb saba > did not time as advised - 03/12/2020  After extensive coaching inhaler device,  effectiveness =    75% rechallnge with breztri  x 2 weeks and if not better back to trelegy but either way both are 1st thing in am as she doesn't get sob until starts her am activities  - 07/07/2022  After extensive coaching inhaler device,  effectiveness  < 25%( short ti, poor insp flow) > rec triple maint rx all per neb if covered  - 12/15/2022 added pred x 6 d as PLAN D - 10/12/2023 renewed pred as plan D and zpak prn purulent sputum   Mild aecopd likely related to dysphgia and perhaps mild aspiration though lungs are clear bilaterally, no def new infiltrates and sats are fine on lower than nl 02 requirements so rec  Rx dysphgia/ esophagitis with combination of clotrimazole troche/ PPI and h2 hs and f/u with Landry Ferrari NP as planned     Chronic respiratory failure with hypoxia (HCC) 02 dep since 2015   -  03/12/2020 :  Saturations on Room Air at Rest = 85%--increased to 92%5lpm pulsed o2 and walked 1 lap with sats and end 92% 5lpm pulsed.  - 12/15/2022 Patient  Saturations on Room Air at Rest = 89% Patient Saturations on Room Air while Ambulating = 83% p 50 ft Patient Saturations on 4 Liters of pulsed oxygen  while Ambulating = 93%  Sats at rest are fine on 2.5 lpm so can despite increase in symptoms related to dyshagia with choking to again advised:  Make sure you check your oxygen  saturation  AT  your highest level of activity (not after you stop)   to be sure it stays over 90% and adjust  02 flow upward to maintain this level if needed but remember to turn it back to previous settings when you stop (to conserve your supply).     AVS  Patient Instructions  Stop calcium and fish oil for at least a week  Pantoprazole (protonix) 40 mg   Take  30-60 min before first meal of the day and Pepcid (famotidine)  20 mg after supper until return to office - this is the best way to tell whether stomach acid is contributing to your problem.    Clotrimzaole 10 mg troche one four time as day as needed    Please remember to go to the  x-ray department  for your tests - we will call you with the results when they are available    Change follow up to 6 months        Rachel America, MD 02/29/2024

## 2024-02-29 NOTE — Assessment & Plan Note (Addendum)
 02 dep since 2015   -  03/12/2020 :  Saturations on Room Air at Rest = 85%--increased to 92%5lpm pulsed o2 and walked 1 lap with sats and end 92% 5lpm pulsed.  - 12/15/2022 Patient Saturations on Room Air at Rest = 89% Patient Saturations on Room Air while Ambulating = 83% p 50 ft Patient Saturations on 4 Liters of pulsed oxygen  while Ambulating = 93%  Sats at rest are fine on 2.5 lpm so can despite increase in symptoms related to dyshagia with choking to again advised:  Make sure you check your oxygen  saturation  AT  your highest level of activity (not after you stop)   to be sure it stays over 90% and adjust  02 flow upward to maintain this level if needed but remember to turn it back to previous settings when you stop (to conserve your supply).

## 2024-02-29 NOTE — Patient Instructions (Addendum)
 Stop calcium and fish oil for at least a week  Pantoprazole (protonix) 40 mg   Take  30-60 min before first meal of the day and Pepcid (famotidine)  20 mg after supper until return to office - this is the best way to tell whether stomach acid is contributing to your problem.    Clotrimzaole 10 mg troche one four time as day as needed    Please remember to go to the  x-ray department  for your tests - we will call you with the results when they are available    Change follow up to 6 months

## 2024-03-01 ENCOUNTER — Ambulatory Visit: Payer: Self-pay | Admitting: Internal Medicine

## 2024-03-06 DIAGNOSIS — I071 Rheumatic tricuspid insufficiency: Secondary | ICD-10-CM | POA: Diagnosis not present

## 2024-03-06 DIAGNOSIS — J9621 Acute and chronic respiratory failure with hypoxia: Secondary | ICD-10-CM | POA: Diagnosis not present

## 2024-03-06 DIAGNOSIS — I10 Essential (primary) hypertension: Secondary | ICD-10-CM

## 2024-03-06 DIAGNOSIS — I4891 Unspecified atrial fibrillation: Secondary | ICD-10-CM | POA: Diagnosis not present

## 2024-03-06 DIAGNOSIS — I361 Nonrheumatic tricuspid (valve) insufficiency: Secondary | ICD-10-CM | POA: Diagnosis not present

## 2024-03-06 DIAGNOSIS — I2721 Secondary pulmonary arterial hypertension: Secondary | ICD-10-CM | POA: Diagnosis not present

## 2024-03-06 DIAGNOSIS — I340 Nonrheumatic mitral (valve) insufficiency: Secondary | ICD-10-CM | POA: Diagnosis not present

## 2024-03-07 DIAGNOSIS — R000 Tachycardia, unspecified: Secondary | ICD-10-CM | POA: Diagnosis not present

## 2024-03-28 ENCOUNTER — Other Ambulatory Visit (HOSPITAL_BASED_OUTPATIENT_CLINIC_OR_DEPARTMENT_OTHER): Admitting: Radiology

## 2024-04-04 DEATH — deceased

## 2024-04-11 ENCOUNTER — Other Ambulatory Visit (HOSPITAL_BASED_OUTPATIENT_CLINIC_OR_DEPARTMENT_OTHER): Admitting: Radiology

## 2024-07-14 ENCOUNTER — Ambulatory Visit: Admitting: Internal Medicine
